# Patient Record
Sex: Female | Born: 1975 | Race: White | Hispanic: No | Marital: Married | Smoking: Former smoker
Health system: Southern US, Community
[De-identification: ages and names within clinical notes are randomized; demographics above are authoritative.]

## PROBLEM LIST (undated history)

## (undated) DIAGNOSIS — K9041 Non-celiac gluten sensitivity: Secondary | ICD-10-CM

## (undated) DIAGNOSIS — N920 Excessive and frequent menstruation with regular cycle: Secondary | ICD-10-CM

## (undated) DIAGNOSIS — N939 Abnormal uterine and vaginal bleeding, unspecified: Secondary | ICD-10-CM

## (undated) DIAGNOSIS — Z973 Presence of spectacles and contact lenses: Secondary | ICD-10-CM

## (undated) DIAGNOSIS — K644 Residual hemorrhoidal skin tags: Secondary | ICD-10-CM

## (undated) DIAGNOSIS — F909 Attention-deficit hyperactivity disorder, unspecified type: Secondary | ICD-10-CM

## (undated) DIAGNOSIS — K219 Gastro-esophageal reflux disease without esophagitis: Secondary | ICD-10-CM

## (undated) DIAGNOSIS — N3281 Overactive bladder: Secondary | ICD-10-CM

## (undated) DIAGNOSIS — F419 Anxiety disorder, unspecified: Secondary | ICD-10-CM

## (undated) DIAGNOSIS — K621 Rectal polyp: Secondary | ICD-10-CM

## (undated) HISTORY — DX: Overactive bladder: N32.81

## (undated) HISTORY — DX: Anxiety disorder, unspecified: F41.9

## (undated) HISTORY — DX: Non-celiac gluten sensitivity: K90.41

## (undated) HISTORY — DX: Gastro-esophageal reflux disease without esophagitis: K21.9

---

## 2006-12-20 ENCOUNTER — Ambulatory Visit: Payer: Self-pay | Admitting: Internal Medicine

## 2006-12-20 DIAGNOSIS — Z9189 Other specified personal risk factors, not elsewhere classified: Secondary | ICD-10-CM | POA: Insufficient documentation

## 2008-01-08 ENCOUNTER — Ambulatory Visit: Payer: Self-pay | Admitting: Family Medicine

## 2008-01-08 DIAGNOSIS — K900 Celiac disease: Secondary | ICD-10-CM | POA: Insufficient documentation

## 2008-01-12 LAB — CONVERTED CEMR LAB
Albumin: 4.2 g/dL (ref 3.5–5.2)
Alkaline Phosphatase: 37 units/L — ABNORMAL LOW (ref 39–117)
BUN: 10 mg/dL (ref 6–23)
Basophils Absolute: 0.1 10*3/uL (ref 0.0–0.1)
Eosinophils Absolute: 0.4 10*3/uL (ref 0.0–0.7)
Eosinophils Relative: 5.8 % — ABNORMAL HIGH (ref 0.0–5.0)
GFR calc Af Amer: 184 mL/min
GFR calc non Af Amer: 152 mL/min
HCT: 38.8 % (ref 36.0–46.0)
MCHC: 34.7 g/dL (ref 30.0–36.0)
MCV: 86.5 fL (ref 78.0–100.0)
Monocytes Absolute: 0.5 10*3/uL (ref 0.1–1.0)
Platelets: 242 10*3/uL (ref 150–400)
Potassium: 4.1 meq/L (ref 3.5–5.1)
RDW: 12.4 % (ref 11.5–14.6)
Sodium: 138 meq/L (ref 135–145)
Total Bilirubin: 0.6 mg/dL (ref 0.3–1.2)

## 2008-07-19 ENCOUNTER — Ambulatory Visit: Payer: Self-pay | Admitting: Family Medicine

## 2008-07-19 DIAGNOSIS — G4700 Insomnia, unspecified: Secondary | ICD-10-CM | POA: Insufficient documentation

## 2008-11-03 ENCOUNTER — Ambulatory Visit: Payer: Self-pay | Admitting: Family Medicine

## 2010-08-03 ENCOUNTER — Encounter: Payer: Self-pay | Admitting: Family Medicine

## 2010-08-04 ENCOUNTER — Encounter: Payer: Self-pay | Admitting: Family Medicine

## 2010-08-04 ENCOUNTER — Ambulatory Visit (INDEPENDENT_AMBULATORY_CARE_PROVIDER_SITE_OTHER): Payer: BC Managed Care – PPO | Admitting: Family Medicine

## 2010-08-04 VITALS — BP 112/78 | HR 76 | Temp 98.9°F | Wt 118.0 lb

## 2010-08-04 DIAGNOSIS — J029 Acute pharyngitis, unspecified: Secondary | ICD-10-CM | POA: Insufficient documentation

## 2010-08-04 MED ORDER — LIDOCAINE VISCOUS 2 % MT SOLN: 5.0000 mL | OROMUCOSAL | Status: DC | PRN

## 2010-08-04 NOTE — Progress Notes (Signed)
duration of symptoms: Several days.  Voice change noted. Rhinorrhea: no congestion:no ear pain:no sore throat:yes cough:minimal myalgias:yes Fevers: no No sick contacts.  No help with dayquil/nyquil.    ROS: See HPI.  Otherwise negative.    Meds, vitals, and allergies reviewed.   GEN: nad, alert and oriented HEENT: mucous membranes moist, TM w/o erythema, nasal epithelium not injected, OP with cobblestoning but no exudates NECK: supple w/o LA CV: rrr. PULM: ctab, no inc wob EXT: no edema  RST neg.

## 2010-08-04 NOTE — Patient Instructions (Addendum)
Drink plenty of fluids, take tylenol or ibuprofen as needed, and gargle with warm salt water (or use the lidocaine) for your throat.  This should gradually improve.  Take care.  Let us know if you have other concerns.

## 2010-08-04 NOTE — Assessment & Plan Note (Signed)
Likely viral, supportive tx and fu prn.  D/w pt.

## 2013-07-24 ENCOUNTER — Ambulatory Visit (INDEPENDENT_AMBULATORY_CARE_PROVIDER_SITE_OTHER): Payer: BC Managed Care – PPO | Admitting: Family Medicine

## 2013-07-24 ENCOUNTER — Encounter: Payer: Self-pay | Admitting: Family Medicine

## 2013-07-24 VITALS — BP 102/70 | HR 84 | Temp 98.0°F | Ht 61.0 in | Wt 107.8 lb

## 2013-07-24 DIAGNOSIS — Z635 Disruption of family by separation and divorce: Secondary | ICD-10-CM | POA: Insufficient documentation

## 2013-07-24 DIAGNOSIS — Z13220 Encounter for screening for lipoid disorders: Secondary | ICD-10-CM

## 2013-07-24 DIAGNOSIS — Z131 Encounter for screening for diabetes mellitus: Secondary | ICD-10-CM

## 2013-07-24 DIAGNOSIS — Z0000 Encounter for general adult medical examination without abnormal findings: Secondary | ICD-10-CM

## 2013-07-24 DIAGNOSIS — Z23 Encounter for immunization: Secondary | ICD-10-CM

## 2013-07-24 LAB — LIPID PANEL
CHOL/HDL RATIO: 2
Cholesterol: 159 mg/dL (ref 0–200)
HDL: 83.8 mg/dL (ref >39.00)
LDL Cholesterol: 61 mg/dL (ref 0–99)
Triglycerides: 70 mg/dL (ref 0.0–149.0)
VLDL: 14 mg/dL (ref 0.0–40.0)

## 2013-07-24 LAB — GLUCOSE, RANDOM: Glucose, Bld: 69 mg/dL — ABNORMAL LOW (ref 70–99)

## 2013-07-24 NOTE — Progress Notes (Signed)
Pre visit review using our clinic review tool, if applicable. No additional management support is needed unless otherwise documented below in the visit note.  CPE- See plan.  Routine anticipatory guidance given to patient.  See health maintenance. Pap done ~1.5years ago per gyn. DXA/mammogram not due Colon cancer screening not due.  Lipids and sugar pending. tdap today.  Living will encouraged.  Mother designated if patient were incapacitated.    Separated from husband.  Soon to move out. At home with husband and 2 daughters now.  Safe at home.  No SI/HI.  Dec in focus, more tearful.  occ appetite and sleep changes.  Worried about the situation, caring for her kids,etc.  Interested in counseling. Prev on zoloft w/o much improvement, that was not recent. D/w pt about counseling and/or meds.   PMH and SH reviewed  Meds, vitals, and allergies reviewed.   ROS: See HPI.  Otherwise negative.    GEN: nad, alert and oriented, tearful but regains composure.  Speech fluent and judgement intact HEENT: mucous membranes moist NECK: supple w/o LA CV: rrr. PULM: ctab, no inc wob ABD: soft, +bs EXT: no edema SKIN: no acute rash

## 2013-07-24 NOTE — Patient Instructions (Signed)
Go see Shirlee Limerick on the way out about getting set up for counseling. Go to the lab on the way out.  We'll contact you with your lab report. Take care.  Glad to see you.

## 2013-07-24 NOTE — Assessment & Plan Note (Signed)
Separated from husband.  Soon to move out. At home with husband and 2 daughters now.  Safe at home.  No SI/HI.  Dec in focus, more tearful.  occ appetite and sleep changes.  Worried about the situation, caring for her kids,etc.  Interested in counseling. Prev on zoloft w/o much improvement, that was not recent. D/w pt about counseling and/or meds. Refer for counseling in the meantime.  Hold off on meds for now.  She contracts for safety.

## 2013-07-24 NOTE — Assessment & Plan Note (Signed)
Routine anticipatory guidance given to patient. See health maintenance.  Pap done ~1.5years ago per gyn.  DXA/mammogram not due  Colon cancer screening not due.  Lipids and sugar pending.  tdap today.  Living will encouraged. Mother designated if patient were incapacitated.

## 2013-08-20 ENCOUNTER — Ambulatory Visit (INDEPENDENT_AMBULATORY_CARE_PROVIDER_SITE_OTHER): Payer: BC Managed Care – PPO | Admitting: Psychology

## 2013-08-20 DIAGNOSIS — F4323 Adjustment disorder with mixed anxiety and depressed mood: Secondary | ICD-10-CM

## 2013-09-09 ENCOUNTER — Ambulatory Visit (INDEPENDENT_AMBULATORY_CARE_PROVIDER_SITE_OTHER): Payer: BC Managed Care – PPO | Admitting: Psychology

## 2013-09-09 DIAGNOSIS — F4323 Adjustment disorder with mixed anxiety and depressed mood: Secondary | ICD-10-CM

## 2013-09-23 ENCOUNTER — Ambulatory Visit (INDEPENDENT_AMBULATORY_CARE_PROVIDER_SITE_OTHER): Payer: BC Managed Care – PPO | Admitting: Psychology

## 2013-09-23 DIAGNOSIS — F4323 Adjustment disorder with mixed anxiety and depressed mood: Secondary | ICD-10-CM

## 2013-10-07 ENCOUNTER — Ambulatory Visit (INDEPENDENT_AMBULATORY_CARE_PROVIDER_SITE_OTHER): Payer: BC Managed Care – PPO | Admitting: Psychology

## 2013-10-07 DIAGNOSIS — F4323 Adjustment disorder with mixed anxiety and depressed mood: Secondary | ICD-10-CM

## 2013-10-28 ENCOUNTER — Ambulatory Visit: Payer: BC Managed Care – PPO | Admitting: Psychology

## 2013-10-28 ENCOUNTER — Telehealth: Payer: Self-pay

## 2013-10-28 ENCOUNTER — Ambulatory Visit (INDEPENDENT_AMBULATORY_CARE_PROVIDER_SITE_OTHER): Payer: BC Managed Care – PPO | Admitting: Psychology

## 2013-10-28 DIAGNOSIS — F331 Major depressive disorder, recurrent, moderate: Secondary | ICD-10-CM

## 2013-10-28 NOTE — Telephone Encounter (Signed)
I need all the records first.  She needs formal testing done.   We can try to go from there.

## 2013-10-28 NOTE — Telephone Encounter (Signed)
Pt has been seeing [facility] and saw Dr Reggy Eye today; pt cannot afford to continue to pay high amts out of pocket to see these behavioral doctors and not see any results. Pt is having problems at work focusing. Dr Reggy Eye did not do ADD testing today. A friend gave pt Adderall XR 30 mg last week and pt took 1/2 tab q 3rd day and felt better. Pt is not sure if she is experiencing ADD or depression or both. Pt wants to know if Dr Para March could prescribe something since pt cannot afford to continue present course of see behavioral doctors.Please advise.

## 2013-10-28 NOTE — Telephone Encounter (Signed)
Pt advised as instructed and pt voiced understanding and will get testing and then contact Dr Lianne Bushy office for appt.

## 2013-11-18 ENCOUNTER — Ambulatory Visit (INDEPENDENT_AMBULATORY_CARE_PROVIDER_SITE_OTHER): Payer: BC Managed Care – PPO | Admitting: Psychology

## 2013-11-18 DIAGNOSIS — F988 Other specified behavioral and emotional disorders with onset usually occurring in childhood and adolescence: Secondary | ICD-10-CM

## 2013-11-18 DIAGNOSIS — F330 Major depressive disorder, recurrent, mild: Secondary | ICD-10-CM

## 2013-11-18 DIAGNOSIS — F411 Generalized anxiety disorder: Secondary | ICD-10-CM

## 2013-11-23 ENCOUNTER — Telehealth: Payer: Self-pay | Admitting: *Deleted

## 2013-11-23 NOTE — Telephone Encounter (Signed)
Patient notified as noted on hard copy. Appointment scheduled with Dr. Para March 11/25/13.

## 2013-11-23 NOTE — Telephone Encounter (Signed)
I made a note on the hard copy.  Please call pt about it. Thanks.

## 2013-11-23 NOTE — Telephone Encounter (Signed)
Pt was calling to see if you have received the results of her ADD/ADHD testing from Dr Rosemarie Beath yet? She says they were supposed to be sent here by last Fri. She also wanted to speak with you re: some anxiety issues she's been having.

## 2013-11-25 ENCOUNTER — Encounter: Payer: Self-pay | Admitting: Family Medicine

## 2013-11-25 ENCOUNTER — Ambulatory Visit (INDEPENDENT_AMBULATORY_CARE_PROVIDER_SITE_OTHER): Payer: BC Managed Care – PPO | Admitting: Family Medicine

## 2013-11-25 VITALS — BP 110/74 | HR 104 | Temp 98.6°F | Wt 106.5 lb

## 2013-11-25 DIAGNOSIS — F418 Other specified anxiety disorders: Secondary | ICD-10-CM

## 2013-11-25 DIAGNOSIS — F341 Dysthymic disorder: Secondary | ICD-10-CM

## 2013-11-25 MED ORDER — BUPROPION HCL ER (SR) 100 MG PO TB12: 100.0000 mg | ORAL_TABLET | Freq: Two times a day (BID) | ORAL | Status: DC

## 2013-11-25 NOTE — Patient Instructions (Signed)
Start with 1 wellbutrin a day for about 3-4 days, then increase to twice a day if needed.  Keep taking benadryl at night and update me next week (or sooner if needed) via mychart or a phone call.   Glad to see you.  Take care.

## 2013-11-25 NOTE — Progress Notes (Signed)
Pre visit review using our clinic review tool, if applicable. No additional management support is needed unless otherwise documented below in the visit note.  Psychology notes reviewed with patient.  She has sx suggestive of ADD, but likely exacerbated/due to underlying depression and anxiety.  Out of her home now, living on her own with her kids.  Separated, safe at home. No SI/HI.  Tearful, worried, fatigued, sleep disrupted. Appetite disrupted.  Trouble concentration.  Anxious.  Asking about options.  Discussed.    Meds, vitals, and allergies reviewed.   ROS: See HPI.  Otherwise, noncontributory.  GEN: nad, alert and oriented, tearful, regains composure.  HEENT: mucous membranes moist NECK: supple w/o LA CV: rrr.  no murmur Speech wnl.  Judgement intact

## 2013-11-26 DIAGNOSIS — F418 Other specified anxiety disorders: Secondary | ICD-10-CM | POA: Insufficient documentation

## 2013-11-26 NOTE — Assessment & Plan Note (Signed)
With sig upheaval at home.  D/w pt.  Would start wellbutrin.  It may help with depression/anxiety and concentration.  SSRI years ago didn't help much.  She agrees.  Routine cautions given, no h/o SZ d/o.  She'll update me in a few days.  Okay for outpatient f/u.

## 2013-12-02 ENCOUNTER — Encounter: Payer: Self-pay | Admitting: Family Medicine

## 2013-12-16 ENCOUNTER — Other Ambulatory Visit: Payer: Self-pay | Admitting: Family Medicine

## 2013-12-16 MED ORDER — BUPROPION HCL ER (SR) 100 MG PO TB12: ORAL_TABLET | ORAL | Status: DC

## 2013-12-22 ENCOUNTER — Encounter: Payer: Self-pay | Admitting: Family Medicine

## 2013-12-23 ENCOUNTER — Encounter: Payer: Self-pay | Admitting: Family Medicine

## 2013-12-23 DIAGNOSIS — R41840 Attention and concentration deficit: Secondary | ICD-10-CM | POA: Insufficient documentation

## 2013-12-29 ENCOUNTER — Telehealth: Payer: Self-pay | Admitting: Family Medicine

## 2013-12-29 MED ORDER — ESCITALOPRAM OXALATE 10 MG PO TABS: ORAL_TABLET | ORAL | Status: DC

## 2013-12-29 NOTE — Telephone Encounter (Signed)
Called pt.  See mychart message.  Would be reasonable to try lexapro.  She'll take 10mg  a day for 1 week, then increase to 20mg  a day if needed at that point. She'll update me in a few days.  She agrees with the plan.  Would continue wellbutrin for now.  rx sent.

## 2014-01-11 ENCOUNTER — Encounter: Payer: Self-pay | Admitting: Family Medicine

## 2014-01-12 ENCOUNTER — Other Ambulatory Visit: Payer: Self-pay | Admitting: Family Medicine

## 2014-01-13 MED ORDER — BUPROPION HCL ER (SR) 100 MG PO TB12: ORAL_TABLET | ORAL | Status: DC

## 2014-01-13 NOTE — Telephone Encounter (Signed)
Sent. Thanks.   

## 2014-02-07 ENCOUNTER — Other Ambulatory Visit: Payer: Self-pay | Admitting: Family Medicine

## 2014-02-07 DIAGNOSIS — F418 Other specified anxiety disorders: Secondary | ICD-10-CM

## 2014-05-19 ENCOUNTER — Other Ambulatory Visit: Payer: Self-pay

## 2014-05-19 ENCOUNTER — Encounter: Payer: Self-pay | Admitting: Family Medicine

## 2014-05-19 DIAGNOSIS — Z1231 Encounter for screening mammogram for malignant neoplasm of breast: Secondary | ICD-10-CM

## 2014-06-02 ENCOUNTER — Ambulatory Visit
Admission: RE | Admit: 2014-06-02 | Discharge: 2014-06-02 | Disposition: A | Discharge status: Home / Self Care | Payer: BLUE CROSS/BLUE SHIELD | Source: Ambulatory Visit

## 2014-06-02 DIAGNOSIS — Z1231 Encounter for screening mammogram for malignant neoplasm of breast: Secondary | ICD-10-CM

## 2014-06-03 ENCOUNTER — Other Ambulatory Visit: Payer: Self-pay | Admitting: Family Medicine

## 2014-06-03 DIAGNOSIS — R928 Other abnormal and inconclusive findings on diagnostic imaging of breast: Secondary | ICD-10-CM

## 2014-06-04 ENCOUNTER — Ambulatory Visit
Admission: RE | Admit: 2014-06-04 | Discharge: 2014-06-04 | Disposition: A | Discharge status: Home / Self Care | Payer: BLUE CROSS/BLUE SHIELD | Source: Ambulatory Visit | Attending: Family Medicine | Admitting: Family Medicine

## 2014-06-04 ENCOUNTER — Encounter: Payer: Self-pay | Admitting: *Deleted

## 2014-06-04 DIAGNOSIS — R928 Other abnormal and inconclusive findings on diagnostic imaging of breast: Secondary | ICD-10-CM

## 2014-06-04 HISTORY — PX: BREAST ENHANCEMENT SURGERY: SHX7

## 2014-10-29 ENCOUNTER — Ambulatory Visit (INDEPENDENT_AMBULATORY_CARE_PROVIDER_SITE_OTHER): Payer: BLUE CROSS/BLUE SHIELD | Admitting: Family Medicine

## 2014-10-29 ENCOUNTER — Encounter: Payer: Self-pay | Admitting: Family Medicine

## 2014-10-29 VITALS — BP 116/62 | HR 102 | Temp 98.3°F | Wt 116.0 lb

## 2014-10-29 DIAGNOSIS — F418 Other specified anxiety disorders: Secondary | ICD-10-CM

## 2014-10-29 MED ORDER — TRAZODONE HCL 50 MG PO TABS: 25.0000 mg | ORAL_TABLET | Freq: Every evening | ORAL | Status: DC | PRN

## 2014-10-29 MED ORDER — DIPHENHYDRAMINE HCL 25 MG PO TABS
25.0000 mg | ORAL_TABLET | Freq: Every evening | ORAL | Status: DC | PRN
Start: 2014-10-29 — End: 2015-01-07

## 2014-10-29 MED ORDER — BUPROPION HCL ER (SR) 100 MG PO TB12: 200.0000 mg | ORAL_TABLET | Freq: Every morning | ORAL | Status: DC

## 2014-10-29 NOTE — Progress Notes (Signed)
Pre visit review using our clinic review tool, if applicable. No additional management support is needed unless otherwise documented below in the visit note.  Living with her kids, separated.  She isn't going back in the relationship.  Safe at home.   She has had lifelong sleep troubles, even before the recent upheaval.   She is taking benadryl at night,  qhs.  She was originally taking it prn, sleep has gotten worse in the last year.   In the last week, going to bed at 2AM, then frequent waking.   She has been on wellbutrin, up to .  She cut back to 200, to cut out the PM dose to see if that would help with sleeping.  She thinks wellbutrin helped with anxiety (  same as ) but not as much with depression.   Lexapro didn't help much with depression. No SI/HI.   Minimal etoh, no illicits.  Minimal caffeine.   She had seen Dr. Tomasa Rand at [place], with little help.  She doesn't have a hx of manic episodes.   She doesn't have dx BAD.     Meds, vitals, and allergies reviewed.   ROS: See HPI.  Otherwise, noncontributory.  nad Tearful but regains composure Speech and judgement wnl rrr ctab abd soft, not ttp Ext w/o edema

## 2014-10-29 NOTE — Assessment & Plan Note (Signed)
Not improved, sleep is worse.  D/w pt about options.  Continue wellbutrin  a day.  Continue prn benadryl Add on trazodone.   She'll update me in about 1 week.  Okay for outpatient f/u.  Stay off lexapro.   We can address remaining mood issues after her sleep situation improves.  We both think her mood may improve with better sleep/rest.  >25 minutes spent in face to face time with patient, >50% spent in counselling or coordination of care.

## 2014-10-29 NOTE — Patient Instructions (Signed)
Continue wellbutrin  a day.  Continue as needed benadryl Add on trazodone, 25-50mg  at night.  Start with .   Update me in about 1 week.  Take care.  Glad to see you.

## 2014-10-29 NOTE — Addendum Note (Signed)
Addended by: Joaquim Nam on: 10/29/2014 08:52 AM   Modules accepted: Orders

## 2014-11-22 ENCOUNTER — Other Ambulatory Visit: Payer: Self-pay | Admitting: Surgery

## 2014-11-22 DIAGNOSIS — R1011 Right upper quadrant pain: Secondary | ICD-10-CM

## 2014-11-30 ENCOUNTER — Ambulatory Visit
Admission: RE | Admit: 2014-11-30 | Discharge: 2014-11-30 | Disposition: A | Discharge status: Home / Self Care | Payer: BLUE CROSS/BLUE SHIELD | Source: Ambulatory Visit | Attending: Surgery | Admitting: Surgery

## 2014-11-30 DIAGNOSIS — R1011 Right upper quadrant pain: Secondary | ICD-10-CM

## 2014-12-08 ENCOUNTER — Other Ambulatory Visit (HOSPITAL_COMMUNITY): Payer: Self-pay | Admitting: Surgery

## 2014-12-08 DIAGNOSIS — R1011 Right upper quadrant pain: Secondary | ICD-10-CM

## 2015-01-07 ENCOUNTER — Ambulatory Visit (INDEPENDENT_AMBULATORY_CARE_PROVIDER_SITE_OTHER): Payer: BLUE CROSS/BLUE SHIELD | Admitting: Family Medicine

## 2015-01-07 ENCOUNTER — Encounter: Payer: Self-pay | Admitting: Family Medicine

## 2015-01-07 VITALS — BP 104/58 | HR 80 | Temp 98.0°F | Ht 61.5 in | Wt 111.0 lb

## 2015-01-07 DIAGNOSIS — F418 Other specified anxiety disorders: Secondary | ICD-10-CM

## 2015-01-07 DIAGNOSIS — Z7189 Other specified counseling: Secondary | ICD-10-CM

## 2015-01-07 DIAGNOSIS — Z0000 Encounter for general adult medical examination without abnormal findings: Secondary | ICD-10-CM

## 2015-01-07 DIAGNOSIS — Z131 Encounter for screening for diabetes mellitus: Secondary | ICD-10-CM

## 2015-01-07 LAB — GLUCOSE, RANDOM: Glucose, Bld: 80 mg/dL (ref 70–99)

## 2015-01-07 MED ORDER — ESCITALOPRAM OXALATE 10 MG PO TABS: ORAL_TABLET | ORAL | Status: DC

## 2015-01-07 NOTE — Progress Notes (Signed)
Pre visit review using our clinic review tool, if applicable. No additional management support is needed unless otherwise documented below in the visit note.  CPE- See plan.  Routine anticipatory guidance given to patient.  See health maintenance. Tetanus 2015 PNA and shingles not due Colon cancer screening not due.  Mammogram 2016 DXA not due.   Pap this year per GYN clinic (4 months ago) Flu shot encouraged.  Declined.   Living will d/w pt.  Would have her mother designated if patient were incapacitated.   Still separated.  Sleeping better on trazodone.  Mood is better overall.  She wanted to try lexapro again.  D/w pt.  Safe at home.  She is still "worried about everything" and wellbutrin wasn't helping a lot with that.  Anxiety is long standing.  wellbutrin helped more with the depression.  No SI/HI.  Not a true allergy to lexapro prev.    PMH and SH reviewed  Meds, vitals, and allergies reviewed.   ROS: See HPI.  Otherwise negative.    GEN: nad, alert and oriented, tearful but regains composure HEENT: mucous membranes moist NECK: supple w/o LA CV: rrr. PULM: ctab, no inc wob ABD: soft, +bs EXT: no edema

## 2015-01-07 NOTE — Patient Instructions (Signed)
Go to the lab on the way out.  We'll contact you with your lab report. Take care.  Glad to see you.  Try adding on the lexapro and update me as needed.

## 2015-01-09 DIAGNOSIS — Z7189 Other specified counseling: Secondary | ICD-10-CM | POA: Insufficient documentation

## 2015-01-09 NOTE — Assessment & Plan Note (Signed)
Depression improved, still with some anxiety. Sleep is better.  Add on lexapro for retrial and she'll update me.  Okay for outpatient f/u.

## 2015-01-09 NOTE — Assessment & Plan Note (Signed)
Tetanus 2015  PNA and shingles not due  Colon cancer screening not due.  Mammogram 2016  DXA not due.  Pap this year per GYN clinic (4 months ago)  Flu shot encouraged. Declined.  Living will d/w pt. Would have her mother designated if patient were incapacitated.  See notes on Dm2 screening with glucose.

## 2015-05-16 ENCOUNTER — Ambulatory Visit (INDEPENDENT_AMBULATORY_CARE_PROVIDER_SITE_OTHER): Payer: 59 | Admitting: Primary Care

## 2015-05-16 ENCOUNTER — Encounter: Payer: Self-pay | Admitting: Primary Care

## 2015-05-16 VITALS — BP 114/64 | HR 80 | Temp 98.3°F | Ht 61.5 in | Wt 121.1 lb

## 2015-05-16 DIAGNOSIS — R202 Paresthesia of skin: Secondary | ICD-10-CM | POA: Diagnosis not present

## 2015-05-16 DIAGNOSIS — R200 Anesthesia of skin: Secondary | ICD-10-CM

## 2015-05-16 NOTE — Progress Notes (Signed)
Subjective:    Patient ID: [name] [name], female    DOB: [birth year], 40 y.o.   MRN: [id number]  HPI  [name] [name] is a 40 year old female who presents today with a chief complaint of numbness/tingling. Her symptoms are located to the right hand to the 3rd and 4th digits. Her symptoms can be felt in her right mid forearm on downward.  Her symptoms have been present for the past 2 weeks. Her symptoms haven't been bothersome until Saturday evening this past weekend as she noticed an increase in numbness.    Denies recent injury or trauma. 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 She's taken ibuprofen without improvement. She's been wearing a brace (designed for carpal tunnel) during the day while typing and intermittently at bedtime for about 1 week and she's noticed no improvement. She's also had a massage without improvement. She endorses a normal TSH from GYN recently, has not had vitamin B12 or folate evaluation. Denies recent injury, shoulder pain, neck pain, back pain.  Review of Systems  Constitutional: Negative for fever.  Musculoskeletal: Positive for myalgias. Negative for arthralgias.  Skin: Negative for color change.  Neurological: Positive for numbness. Negative for weakness.       Past Medical History  Diagnosis Date  . Depression   . Blood in stool   . Gluten intolerance     allergy  . Overactive bladder     that is triggered by stress    Social History   Social History  . Marital Status: Divorced    Spouse Name: N/A  . Number of Children: N/A  . Years of Education: N/A   Occupational History  . Not on file.   Social History Main Topics  . Smoking status: Former Smoker    Quit date: 12/03/2005  . Smokeless tobacco: Not on file     Comment: Quit Oct 2007  . Alcohol Use: 0.0 oz/week    0 Standard drinks or equivalent per week     Comment: occassionally  . Drug Use: No  . Sexual Activity: Not on file   Other Topics Concern    . Not on file   Social History Narrative   Occupation: IT consultantparalegal      G3P2-- 2 daughters      Separated as of 2015      Former Smoker (quit oct 07)    No past surgical history on file.  Family History  Problem Relation Age of Onset  . Arthritis      Grandparents  . Lung cancer      grandparent  . Multiple sclerosis Maternal Aunt   . Cancer Maternal Aunt     ? kind  . Breast cancer Neg Hx   . Colon cancer Neg Hx     No Known Allergies  Current Outpatient Prescriptions on File Prior to Visit  Medication Sig Dispense Refill  . traZODone (DESYREL) 50 MG tablet Take 0.5-1 tablets (25-50 mg total) by mouth at bedtime as needed for sleep. 30 tablet 12   No current facility-administered medications on file prior to visit.    BP 114/64 mmHg  Pulse 80  Temp(Src) 98.3 F (36.8 C) (Oral)  Ht 5' 1.5" (1.562 m)  Wt 121 lb 1.9 oz (54.94 kg)  BMI 22.52 kg/m2  SpO2 99%    Objective:   Physical Exam  Constitutional: She appears well-nourished.  Cardiovascular: Normal rate and regular rhythm.   Pulmonary/Chest: Effort  normal and breath sounds normal.  Musculoskeletal:       Right shoulder: She exhibits normal range of motion, no tenderness and no pain.       Right wrist: She exhibits normal range of motion, no tenderness and no swelling.       Cervical back: She exhibits normal range of motion, no tenderness and no pain.  Neurological:  Negative Phalen's and Tinel's sign.  Skin: Skin is warm and dry.          Assessment & Plan:  Numbness/Tingling:  Present to 3rd and 4th digits of right hand. Also to right mid forearm. Works typing on her computer 4 hours daily. No recent injury. Exam with negative Phalen's and Tinel's sign. No color changes. Do not suspect this is originating from neck or shoulder. No not suspect raynauds at this point. Suspect more carpal tunnel at this point. Will have her try naproxen, continue wrist splint, rest. Offered lab work to rule out  possible metabolic causes, she declines.  If no improvement in 2 weeks, will consider B12, folate, EMG testing.

## 2015-05-16 NOTE — Progress Notes (Signed)
Pre visit review using our clinic review tool, if applicable. No additional management support is needed unless otherwise documented below in the visit note. 

## 2015-05-16 NOTE — Patient Instructions (Signed)
Continue to wear your splint while typing at work. Also wear this at bedtime.  You may try taking Naproxen twice daily as needed.  Please notify me if no improvement in symptoms in 2 weeks. We will consider sending you to neurology for further evaluation and nerve conduction studies.  It was a pleasure meeting you!

## 2015-05-19 ENCOUNTER — Encounter: Payer: Self-pay | Admitting: Family Medicine

## 2015-05-24 ENCOUNTER — Encounter: Payer: Self-pay | Admitting: Family Medicine

## 2015-05-24 ENCOUNTER — Ambulatory Visit (INDEPENDENT_AMBULATORY_CARE_PROVIDER_SITE_OTHER): Payer: 59 | Admitting: Family Medicine

## 2015-05-24 VITALS — BP 98/64 | HR 95 | Temp 99.0°F | Wt 118.2 lb

## 2015-05-24 DIAGNOSIS — F909 Attention-deficit hyperactivity disorder, unspecified type: Secondary | ICD-10-CM

## 2015-05-24 DIAGNOSIS — R202 Paresthesia of skin: Secondary | ICD-10-CM | POA: Diagnosis not present

## 2015-05-24 DIAGNOSIS — F988 Other specified behavioral and emotional disorders with onset usually occurring in childhood and adolescence: Secondary | ICD-10-CM

## 2015-05-24 MED ORDER — METHYLPHENIDATE HCL 5 MG PO TABS: 5.0000 mg | ORAL_TABLET | Freq: Two times a day (BID) | ORAL | Status: DC

## 2015-05-24 NOTE — Progress Notes (Signed)
Pre visit review using our clinic review tool, if applicable. No additional management support is needed unless otherwise documented below in the visit note.  R 3rd and 4th finger numbness, currently splinted w/o much relief yet, after recent OV.  R handed.  Worse writing.  A lot of time typing.  No weakness in the hands.    Prev ADD testing done, positive.  She was prev on wellbutrin and SSRI.  lexapro didn't help much.  She had gotten off both meds, with night sweats initially- resolved now.  D/w pt about options.   Wellbutrin helped with depression more than anxiety.   She was asking about tx for concentration.   She feels overwhelmed and that is contributing to anxiety.  "I can't get anything done. I can't stay focused."   No SI/HI. No illicits.  Non smoker.    Prev psych testing report reviewed, with ADD sx likely.   PMH and SH reviewed  ROS: See HPI, otherwise noncontributory.  Meds, vitals, and allergies reviewed.   GEN: nad, alert and oriented HEENT: mucous membranes moist NECK: supple w/o LA CV: rrr. PULM: ctab, no inc wob ABD: soft, +bs EXT: no edema SKIN: no acute rash Tinel pos R wrist but strength wnl R RUE from shoulder down through the fingers.

## 2015-05-24 NOTE — Patient Instructions (Addendum)
If your hand isn't getting better next week, then let me know.   Start with 5mg  ritalin twice a day.  Gradually work up to 10mg  twice a day as needed.  Update me as needed.  Take care.  Glad to see you.

## 2015-05-25 ENCOUNTER — Encounter: Payer: Self-pay | Admitting: Family Medicine

## 2015-05-25 DIAGNOSIS — R202 Paresthesia of skin: Secondary | ICD-10-CM | POA: Insufficient documentation

## 2015-05-25 DIAGNOSIS — F988 Other specified behavioral and emotional disorders with onset usually occurring in childhood and adolescence: Secondary | ICD-10-CM | POA: Insufficient documentation

## 2015-05-25 NOTE — Assessment & Plan Note (Signed)
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  No weakness.

## 2015-05-25 NOTE — Assessment & Plan Note (Signed)
Routine cautions re: ritalin start, with low dose initially, gradually working up to 10mg  bid . She'll update me.  Routine ADE possibilities d/w pt, ie tremor, appetite changes, insomnia.  We may transition to long acting agent if responsive to med.  Stop med and notify me if ADE.  She agrees.  Okay for outpatient f/u.  >25 minutes spent in face to face time with patient, >50% spent in counselling or coordination of care

## 2015-05-25 NOTE — Telephone Encounter (Signed)
Pt left v/m requesting to be emailed back when Dr Para [facility]can [facility]

## 2015-05-26 ENCOUNTER — Telehealth: Payer: Self-pay | Admitting: *Deleted

## 2015-05-26 NOTE — Telephone Encounter (Signed)
Patient email stated that [facility] would be contacting us for a PA on Methylpenidate 5 mg tablet.  [facility] did not contact me but I [facility] in the PA [facility].  Approval was given instantly and faxed approval letter followed stating coverage was good from 05/26/15 to 05/25/16.  [facility] and [facility].

## 2015-05-31 ENCOUNTER — Encounter: Payer: Self-pay | Admitting: Family Medicine

## 2015-06-01 ENCOUNTER — Encounter: Payer: Self-pay | Admitting: Family Medicine

## 2015-06-08 ENCOUNTER — Encounter: Payer: Self-pay | Admitting: Family Medicine

## 2015-06-08 ENCOUNTER — Telehealth: Payer: Self-pay | Admitting: Family Medicine

## 2015-06-08 ENCOUNTER — Other Ambulatory Visit: Payer: Self-pay | Admitting: Family Medicine

## 2015-06-08 MED ORDER — AMPHETAMINE-DEXTROAMPHETAMINE 5 MG PO TABS: 5.0000 mg | ORAL_TABLET | Freq: Two times a day (BID) | ORAL | Status: DC

## 2015-06-08 NOTE — Progress Notes (Signed)
rx printed for pickup.  Thanks.

## 2015-06-08 NOTE — Progress Notes (Signed)
Written prescription given to patient per Dr. Para Marchuncan.

## 2015-06-08 NOTE — Telephone Encounter (Signed)
Prescription given to patient per Dr. Para Marchuncan.

## 2015-06-08 NOTE — Telephone Encounter (Signed)
Pt came by to pick up rx, and i could not locate it. Pt is waiting in the lobby for rx

## 2015-06-09 ENCOUNTER — Telehealth: Payer: Self-pay | Admitting: *Deleted

## 2015-06-09 NOTE — Telephone Encounter (Signed)
[facility]  [facility] and [facility].  [facility]. Sent for scanning.

## 2015-06-21 ENCOUNTER — Encounter: Payer: Self-pay | Admitting: Family Medicine

## 2015-06-23 ENCOUNTER — Other Ambulatory Visit: Payer: Self-pay | Admitting: Family Medicine

## 2015-06-23 MED ORDER — AMPHETAMINE-DEXTROAMPHETAMINE 5 MG PO TABS: ORAL_TABLET | ORAL | Status: DC

## 2015-06-23 MED ORDER — AMPHETAMINE-DEXTROAMPHETAMINE 15 MG PO TABS: 15.0000 mg | ORAL_TABLET | Freq: Two times a day (BID) | ORAL | Status: DC

## 2015-07-21 ENCOUNTER — Telehealth: Payer: Self-pay | Admitting: Family Medicine

## 2015-07-21 ENCOUNTER — Encounter: Payer: Self-pay | Admitting: Family Medicine

## 2015-07-21 MED ORDER — AMPHETAMINE-DEXTROAMPHETAMINE 15 MG PO TABS: 15.0000 mg | ORAL_TABLET | Freq: Two times a day (BID) | ORAL | Status: DC

## 2015-07-21 NOTE — Telephone Encounter (Signed)
rx printed.  Please start PA for this dose.  Thanks.  See mychart message.

## 2015-07-22 NOTE — Telephone Encounter (Signed)
Sent patient a MyChart message that the Rx is ready for pickup and that the PA has been started.

## 2015-08-17 ENCOUNTER — Other Ambulatory Visit: Payer: Self-pay | Admitting: Family Medicine

## 2015-08-17 MED ORDER — AMPHETAMINE-DEXTROAMPHETAMINE 15 MG PO TABS: 15.0000 mg | ORAL_TABLET | Freq: Two times a day (BID) | ORAL | Status: DC

## 2015-08-17 NOTE — Telephone Encounter (Signed)
Last refill 07/21/15 #60 Last office visit 05/24/15

## 2015-08-17 NOTE — Telephone Encounter (Signed)
Patient notified by telephone that script is up front ready for pickup. 

## 2015-08-17 NOTE — Telephone Encounter (Signed)
Printed.  Thanks.  

## 2015-09-15 ENCOUNTER — Other Ambulatory Visit: Payer: Self-pay | Admitting: Family Medicine

## 2015-09-15 MED ORDER — AMPHETAMINE-DEXTROAMPHETAMINE 15 MG PO TABS: 15.0000 mg | ORAL_TABLET | Freq: Two times a day (BID) | ORAL | Status: DC

## 2015-09-15 NOTE — Telephone Encounter (Signed)
Tried to phone the patient, no answer and mailbox was full.  Rx left at front desk for pick up.

## 2015-09-15 NOTE — Telephone Encounter (Signed)
Printed.  Thanks.  

## 2015-09-15 NOTE — Telephone Encounter (Signed)
MyChart refill request.  Last Filled:    60 tablet 0 08/17/2015  CPE:  01/07/15   Please advise.

## 2015-09-16 NOTE — Telephone Encounter (Signed)
Patient notified as instructed by telephone and was advised that she just picked the script up.

## 2015-12-14 ENCOUNTER — Other Ambulatory Visit: Payer: Self-pay | Admitting: Family Medicine

## 2015-12-14 MED ORDER — AMPHETAMINE-DEXTROAMPHETAMINE 15 MG PO TABS: 15.0000 mg | ORAL_TABLET | Freq: Two times a day (BID) | ORAL | 0 refills | Status: DC

## 2015-12-14 NOTE — Telephone Encounter (Signed)
Printed x3.  Thanks.  

## 2015-12-14 NOTE — Telephone Encounter (Signed)
Last refill was on 09/15/15, last OV 05/24/15. Ok to refill?

## 2015-12-14 NOTE — Telephone Encounter (Signed)
Pt was notified rx is ready for pick up.  

## 2015-12-31 IMAGING — MG MM DIAG BREAST TOMO UNI LEFT
4 series · 4 of 12 positions shown · non-contrast
Comparison: Baseline screening mammogram dated 06/02/2014.

CLINICAL DATA: Possible asymmetry in the upper outer left breast on
a recent baseline screening mammogram.

EXAM:
DIGITAL DIAGNOSTIC LEFT MAMMOGRAM WITH 3D TOMOSYNTHESIS AND CAD

[L MLO]
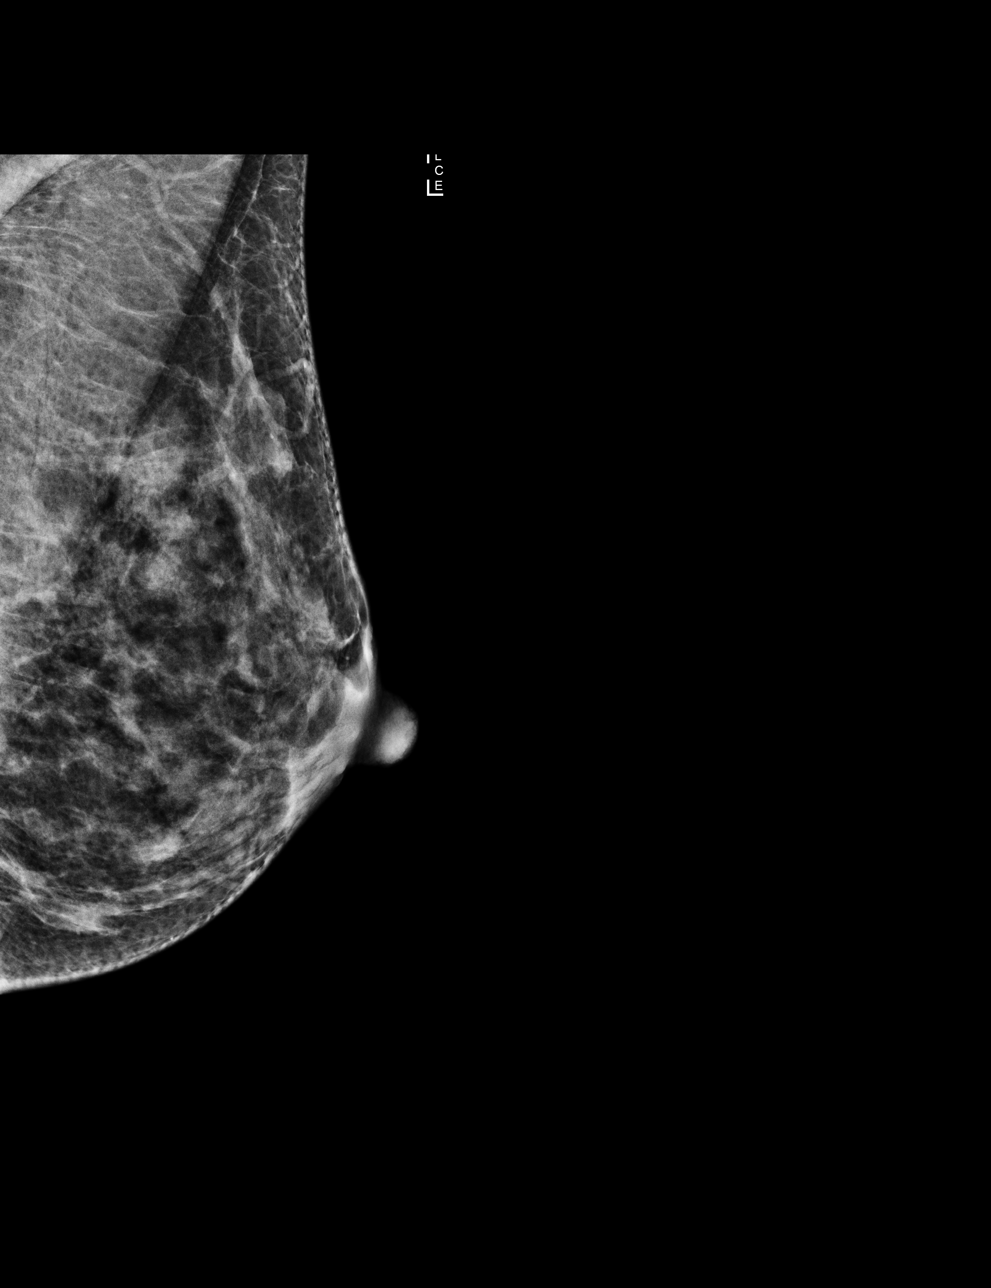

[L CC]
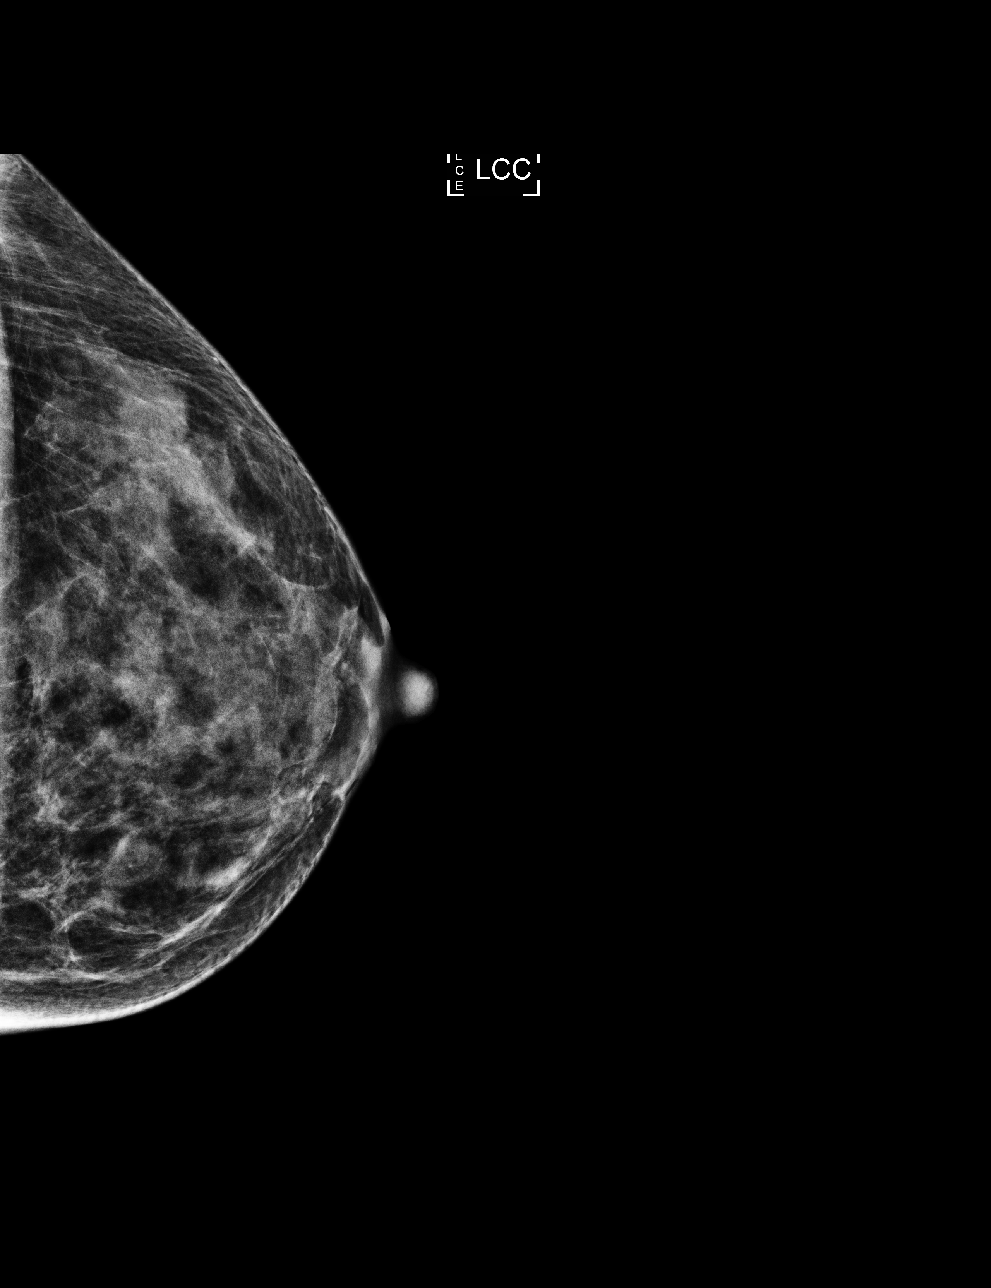

[L MLO tomo · tomo slice 21/41.0]
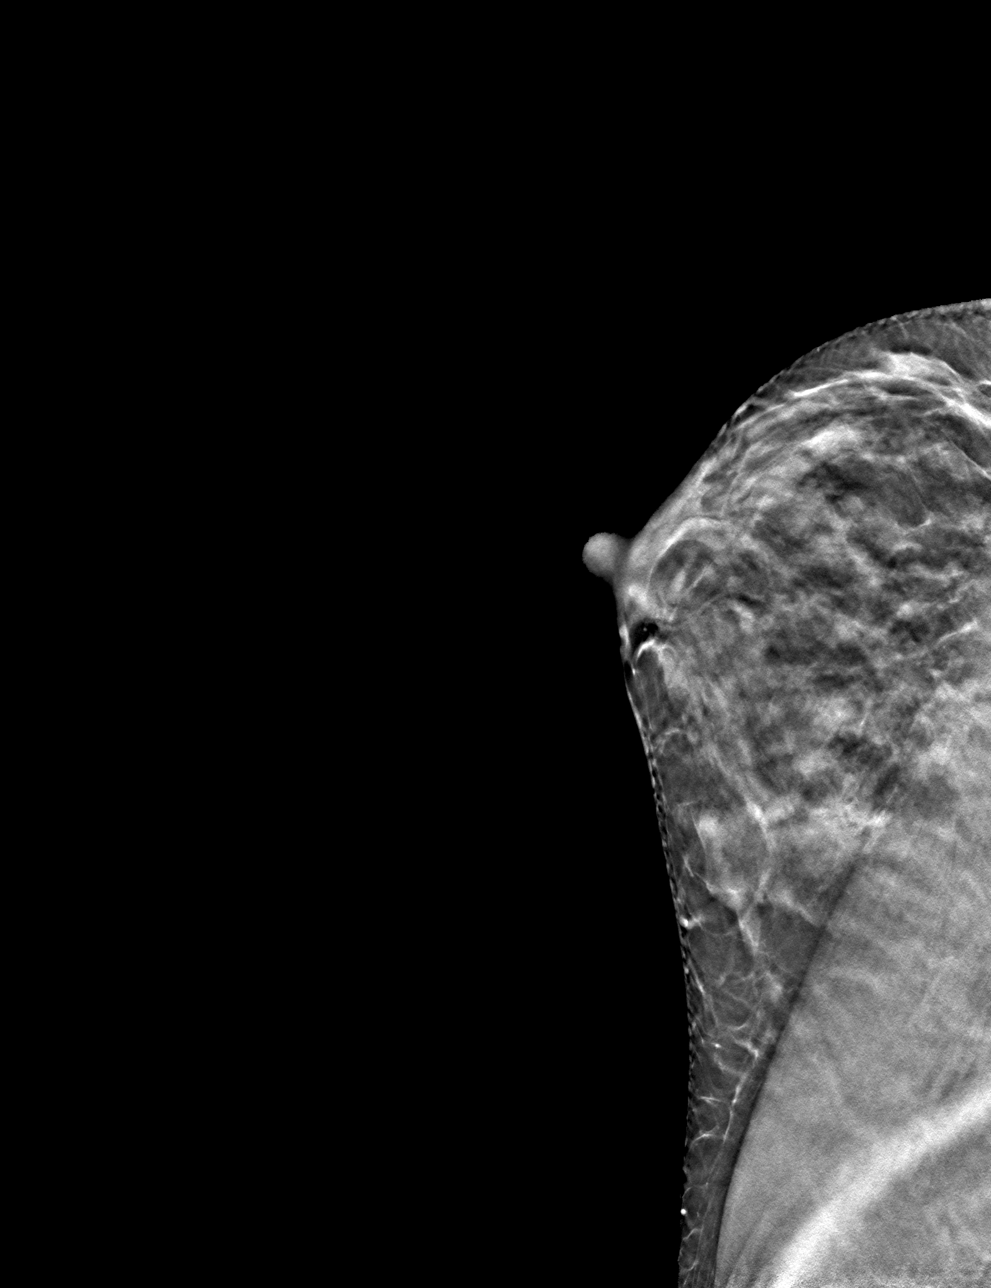

[L CC tomo · tomo slice 21/42.0]
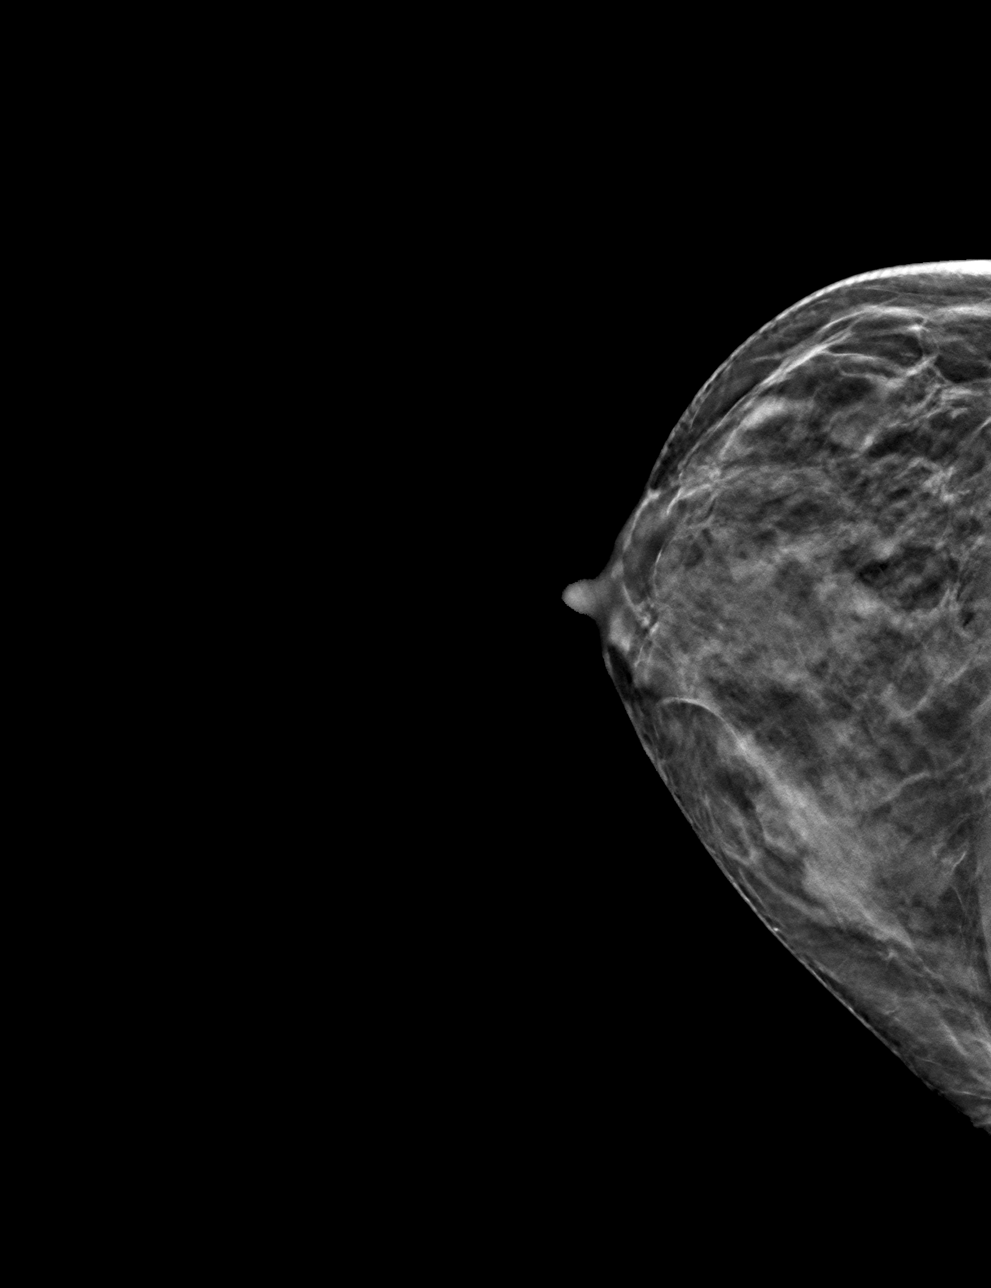

[4 of 12 positions shown; findings below may reference images not displayed]

ACR Breast Density Category c: The breast tissue is heterogeneously
dense, which may obscure small masses.
FINDINGS: 3D tomographic images of the left breast demonstrate normal
appearing fibroglandular tissue at the location of the recently
suspected asymmetry in the posterior aspect of the upper-outer left
breast.

Mammographic images were processed with CAD.
IMPRESSION: No evidence of malignancy. The recently suspected left breast
asymmetry represented overlapping of normal fibroglandular tissue.

RECOMMENDATION:
Annual screening mammography beginning at age 40.

I have discussed the findings and recommendations with the patient.
Results were also provided in writing at the conclusion of the
visit. If applicable, a reminder letter will be sent to the patient
regarding the next appointment.

BI-RADS CATEGORY  1: Negative.

## 2016-01-30 ENCOUNTER — Other Ambulatory Visit: Payer: Self-pay | Admitting: Family Medicine

## 2016-01-30 ENCOUNTER — Encounter: Payer: Self-pay | Admitting: Family Medicine

## 2016-01-30 NOTE — Telephone Encounter (Signed)
Electronic refill request. Last Filled:    30 tablet 12 10/29/2014  Last office visit:   05/24/15   Please advise.

## 2016-01-31 NOTE — Telephone Encounter (Signed)
Sent. Thanks.   

## 2016-02-09 NOTE — H&P (Signed)
Patient name [name] [name], [name] DICTATION# [id number] CSN# [id number]  [name],[name], MD 02/09/2016 2:14 PM

## 2016-02-10 NOTE — H&P (Signed)
[name] [name]:  [name], [name]             ACCOUNT NO.:  [id number]  MEDICAL RECORD NO.:  [id number]  LOCATION:                                 FACILITY:  PHYSICIAN:  Juluis MireJohn S. Amarisa [name], M.D.        DATE OF BIRTH:  DATE OF ADMISSION: DATE OF DISCHARGE:                             HISTORY & PHYSICAL   DATE OF SURGERY:  February 20, 2016.  HISTORY OF PRESENT ILLNESS:  The patient is a 40 year old, gravida 3, para 2 female, comes in for removal of IUD, hysteroscopy, NovaSure ablation, and bilateral tubal fulguration.  In relation to present admission, the patient was initially seen in our practice on September 09, 2015.  At that point in time, she was having regular periods every 6 to 7 days of flow, 2 to 3 days being heavy, changing tampons every 2 to 3 hours.  We discussed options and decided to try the IUD, this was inserted in September 2016.  Subsequently, she continued to have abnormal bleeding.  Ultrasound confirmed proper placement of the IUD with no evidence of uterine or endometrial abnormalities.  Because of continued bleeding and problems with the IUD, she has decided proceed with more aggressive therapy.  She will proceed with laparoscopic tubal followed by NovaSure ablation.  ALLERGIES:  She has no known drug allergies.  MEDICATIONS:  She is on Adderall 15 mg twice a day and trazodone 5 mg at night.  PAST SURGICAL HISTORY:  She has had breast augmentation.  OBSTETRICAL HISTORY:  She has had 2 vaginal deliveries and 1 miscarriage.  SOCIAL HISTORY:  Reveals no tobacco use or alcohol use.  FAMILY HISTORY:  Noncontributory.  REVIEW OF SYSTEMS:  Noncontributory.  PHYSICAL EXAMINATION:  VITAL SIGNS:  The patient is afebrile, stable vital signs. HEENT:  The patient is normocephalic.  Pupils are equal, round, and reactive to light and accommodation.  Extraocular movements were intact. Sclerae and conjunctivae were clear.  Oropharynx clear. LUNGS:  Clear. CARDIOVASCULAR:  Regular  rate.  No murmurs or gallops.  No carotid or abdominal bruits. ABDOMEN:  Benign.  No mass, organomegaly, or tenderness. PELVIC:  Normal external genitalia.  Vaginal mucosa is clear.  Cervix unremarkable.  IUD string noted usual size, shape, and contour.  Adnexa free of mass or tenderness. EXTREMITIES:  Trace edema. NEUROLOGIC:  Grossly within normal limits.  IMPRESSION: 1. Menorrhagia with continued abnormal bleeding despite Mirena IUD. 2. Desires sterility.  PLAN:  The patient will undergo laparoscopic bilateral tubal ligation. The potential risks of surgery have been explained including the risk of infection.  Risk of hemorrhage that could require transfusion with the risk of AIDS or hepatitis.  Risk of injury to adjacent organs including bladder or bowel that could require further exploratory surgery.  Risk of deep venous thrombosis and pulmonary embolus.  Potential irreversibility of sterilization explained.  A failure rate of 1 in 200 is quoted.  Failures can be in the form of ectopic pregnancy requiring further surgical management.  In terms of the hysteroscopy and ablation, risks have been discussed including the risk of infection.  The risk of vascular injury that could lead to hemorrhage requiring transfusion with risk of AIDS or hepatitis.  Excessive bleeding that could require hysterectomy.  There is a risk of perforation of injury to adjacent organs, requiring laparoscopy and possible exploratory surgery for repair.  Risk of deep venous thrombosis and pulmonary embolus.  Success rates of approximately 85% are quoted for ablation.  The IUD was also going to be removed.  The patient expressed understanding of indications, risks, and alternatives.     Juluis MireJohn S. [name] [name], M.D.     JSM/MEDQ  D:  02/09/2016  T:  02/10/2016  Job:  [id number]

## 2016-02-13 DIAGNOSIS — N920 Excessive and frequent menstruation with regular cycle: Secondary | ICD-10-CM | POA: Diagnosis not present

## 2016-02-13 DIAGNOSIS — Z302 Encounter for sterilization: Secondary | ICD-10-CM | POA: Diagnosis not present

## 2016-02-16 ENCOUNTER — Encounter (HOSPITAL_BASED_OUTPATIENT_CLINIC_OR_DEPARTMENT_OTHER): Payer: Self-pay | Admitting: *Deleted

## 2016-02-16 NOTE — Progress Notes (Signed)
NPO AFTER MN.  ARRIVE AT 0600. GETTING LAB WORK DONE Friday 02-17-2016 (CBC, hCG SERUM).

## 2016-02-17 DIAGNOSIS — Z79899 Other long term (current) drug therapy: Secondary | ICD-10-CM | POA: Diagnosis not present

## 2016-02-17 DIAGNOSIS — N920 Excessive and frequent menstruation with regular cycle: Secondary | ICD-10-CM | POA: Diagnosis not present

## 2016-02-17 DIAGNOSIS — Z302 Encounter for sterilization: Secondary | ICD-10-CM | POA: Diagnosis not present

## 2016-02-17 DIAGNOSIS — Z87891 Personal history of nicotine dependence: Secondary | ICD-10-CM | POA: Diagnosis not present

## 2016-02-17 LAB — CBC
HCT: 42.9 % (ref 36.0–46.0)
HEMOGLOBIN: 14.5 g/dL (ref 12.0–15.0)
MCH: 30.6 pg (ref 26.0–34.0)
MCHC: 33.8 g/dL (ref 30.0–36.0)
MCV: 90.5 fL (ref 78.0–100.0)
PLATELETS: 304 10*3/uL (ref 150–400)
RBC: 4.74 MIL/uL (ref 3.87–5.11)
RDW: 13.3 % (ref 11.5–15.5)
WBC: 9.3 10*3/uL (ref 4.0–10.5)

## 2016-02-17 LAB — HCG, SERUM, QUALITATIVE: PREG SERUM: NEGATIVE

## 2016-02-19 NOTE — Anesthesia Preprocedure Evaluation (Addendum)
Anesthesia Evaluation  Patient identified by MRN, date of birth, ID band Patient awake    Reviewed: Allergy & Precautions, NPO status , Patient's Chart, lab work & pertinent test results  Airway Mallampati: I  TM Distance: >3 FB Neck ROM: Full    Dental  (+) Dental Advisory Given, Teeth Intact   Pulmonary neg pulmonary ROS, former smoker,    breath sounds clear to auscultation       Cardiovascular Exercise Tolerance: Good negative cardio ROS   Rhythm:Regular Rate:Normal     Neuro/Psych negative neurological ROS     GI/Hepatic negative GI ROS, Neg liver ROS,   Endo/Other  negative endocrine ROS  Renal/GU negative Renal ROS     Musculoskeletal   Abdominal   Peds  Hematology negative hematology ROS (+)   Anesthesia Other Findings   Reproductive/Obstetrics                           Lab Results  Component Value Date   WBC 9.3 02/17/2016   HGB 14.5 02/17/2016   HCT 42.9 02/17/2016   MCV 90.5 02/17/2016   PLT 304 02/17/2016   Lab Results  Component Value Date   CREATININE 0.5 01/08/2008   BUN 10 01/08/2008   NA 138 01/08/2008   K 4.1 01/08/2008   CL 103 01/08/2008   CO2 31 01/08/2008    Anesthesia Physical Anesthesia Plan  ASA: I  Anesthesia Plan: General   Post-op Pain Management:    Induction: Intravenous  Airway Management Planned: Oral ETT  Additional Equipment:   Intra-op Plan:   Post-operative Plan: Extubation in OR  Informed Consent: I have reviewed the patients History and Physical, chart, labs and discussed the procedure including the risks, benefits and alternatives for the proposed anesthesia with the patient or authorized representative who has indicated his/her understanding and acceptance.   Dental advisory given  Plan Discussed with:   Anesthesia Plan Comments:        Anesthesia Quick Evaluation

## 2016-02-20 ENCOUNTER — Ambulatory Visit (HOSPITAL_BASED_OUTPATIENT_CLINIC_OR_DEPARTMENT_OTHER)
Admission: RE | Admit: 2016-02-20 | Discharge: 2016-02-20 | Disposition: A | Discharge status: Home / Self Care | Payer: BLUE CROSS/BLUE SHIELD | Source: Ambulatory Visit | Attending: Obstetrics and Gynecology | Admitting: Obstetrics and Gynecology

## 2016-02-20 ENCOUNTER — Encounter (HOSPITAL_BASED_OUTPATIENT_CLINIC_OR_DEPARTMENT_OTHER): Payer: Self-pay

## 2016-02-20 ENCOUNTER — Ambulatory Visit (HOSPITAL_BASED_OUTPATIENT_CLINIC_OR_DEPARTMENT_OTHER): Payer: BLUE CROSS/BLUE SHIELD | Admitting: Anesthesiology

## 2016-02-20 ENCOUNTER — Encounter (HOSPITAL_BASED_OUTPATIENT_CLINIC_OR_DEPARTMENT_OTHER)
Admission: RE | Disposition: A | Discharge status: Home / Self Care | Payer: Self-pay | Source: Ambulatory Visit | Attending: Obstetrics and Gynecology

## 2016-02-20 DIAGNOSIS — Z302 Encounter for sterilization: Secondary | ICD-10-CM | POA: Diagnosis not present

## 2016-02-20 DIAGNOSIS — Z87891 Personal history of nicotine dependence: Secondary | ICD-10-CM | POA: Insufficient documentation

## 2016-02-20 DIAGNOSIS — N920 Excessive and frequent menstruation with regular cycle: Secondary | ICD-10-CM | POA: Diagnosis not present

## 2016-02-20 DIAGNOSIS — Z79899 Other long term (current) drug therapy: Secondary | ICD-10-CM | POA: Insufficient documentation

## 2016-02-20 DIAGNOSIS — Z30432 Encounter for removal of intrauterine contraceptive device: Secondary | ICD-10-CM | POA: Diagnosis not present

## 2016-02-20 HISTORY — DX: Abnormal uterine and vaginal bleeding, unspecified: N93.9

## 2016-02-20 HISTORY — PX: LAPAROSCOPIC TUBAL LIGATION: SHX1937

## 2016-02-20 HISTORY — DX: Presence of spectacles and contact lenses: Z97.3

## 2016-02-20 HISTORY — PX: DILITATION & CURRETTAGE/HYSTROSCOPY WITH NOVASURE ABLATION: SHX5568

## 2016-02-20 HISTORY — DX: Attention-deficit hyperactivity disorder, unspecified type: F90.9

## 2016-02-20 HISTORY — PX: IUD REMOVAL: SHX5392

## 2016-02-20 HISTORY — DX: Excessive and frequent menstruation with regular cycle: N92.0

## 2016-02-20 LAB — POCT PREGNANCY, URINE: Preg Test, Ur: NEGATIVE

## 2016-02-20 SURGERY — LIGATION, FALLOPIAN TUBE, LAPAROSCOPIC
Anesthesia: General

## 2016-02-20 MED ORDER — MIDAZOLAM HCL 5 MG/5ML IJ SOLN
INTRAMUSCULAR | Status: DC | PRN
  Administered 2016-02-20: 2 mg via INTRAVENOUS

## 2016-02-20 MED ORDER — PROPOFOL 10 MG/ML IV BOLUS
INTRAVENOUS | Status: DC | PRN
  Administered 2016-02-20: 120 mg via INTRAVENOUS

## 2016-02-20 MED ORDER — ROCURONIUM BROMIDE 10 MG/ML (PF) SYRINGE
PREFILLED_SYRINGE | INTRAVENOUS | Status: DC | PRN
  Administered 2016-02-20: 30 mg via INTRAVENOUS

## 2016-02-20 MED ORDER — SUGAMMADEX SODIUM 200 MG/2ML IV SOLN
INTRAVENOUS | Status: DC | PRN
  Administered 2016-02-20: 130 mg via INTRAVENOUS

## 2016-02-20 MED ORDER — ONDANSETRON HCL 4 MG/2ML IJ SOLN
INTRAMUSCULAR | Status: AC
  Filled 2016-02-20: qty 2

## 2016-02-20 MED ORDER — ONDANSETRON HCL 4 MG/2ML IJ SOLN
INTRAMUSCULAR | Status: DC | PRN
  Administered 2016-02-20: 4 mg via INTRAVENOUS

## 2016-02-20 MED ORDER — DEXAMETHASONE SODIUM PHOSPHATE 10 MG/ML IJ SOLN
INTRAMUSCULAR | Status: AC
  Filled 2016-02-20: qty 1

## 2016-02-20 MED ORDER — LIDOCAINE 2% (20 MG/ML) 5 ML SYRINGE
INTRAMUSCULAR | Status: DC | PRN
  Administered 2016-02-20: 65 mg via INTRAVENOUS

## 2016-02-20 MED ORDER — DEXAMETHASONE SODIUM PHOSPHATE 4 MG/ML IJ SOLN
INTRAMUSCULAR | Status: DC | PRN
  Administered 2016-02-20: 10 mg via INTRAVENOUS

## 2016-02-20 MED ORDER — MIDAZOLAM HCL 2 MG/2ML IJ SOLN
INTRAMUSCULAR | Status: AC
  Filled 2016-02-20: qty 2

## 2016-02-20 MED ORDER — ARTIFICIAL TEARS OP OINT
TOPICAL_OINTMENT | OPHTHALMIC | Status: AC
  Filled 2016-02-20: qty 3.5

## 2016-02-20 MED ORDER — KETOROLAC TROMETHAMINE 30 MG/ML IJ SOLN
30.0000 mg | Freq: Once | INTRAMUSCULAR | Status: AC | PRN
  Administered 2016-02-20: 30 mg via INTRAVENOUS
  Filled 2016-02-20: qty 1

## 2016-02-20 MED ORDER — PROPOFOL 10 MG/ML IV BOLUS
INTRAVENOUS | Status: AC
  Filled 2016-02-20: qty 40

## 2016-02-20 MED ORDER — LACTATED RINGERS IV SOLN
INTRAVENOUS | Status: DC
  Administered 2016-02-20 (×2): via INTRAVENOUS
  Filled 2016-02-20: qty 1000

## 2016-02-20 MED ORDER — CEFAZOLIN SODIUM-DEXTROSE 2-4 GM/100ML-% IV SOLN
INTRAVENOUS | Status: AC
  Filled 2016-02-20: qty 100

## 2016-02-20 MED ORDER — SUGAMMADEX SODIUM 200 MG/2ML IV SOLN
INTRAVENOUS | Status: AC
  Filled 2016-02-20: qty 2

## 2016-02-20 MED ORDER — ROCURONIUM BROMIDE 50 MG/5ML IV SOSY
PREFILLED_SYRINGE | INTRAVENOUS | Status: AC
  Filled 2016-02-20: qty 5

## 2016-02-20 MED ORDER — BUPIVACAINE HCL (PF) 0.5 % IJ SOLN
INTRAMUSCULAR | Status: DC | PRN
  Administered 2016-02-20: 3 mL

## 2016-02-20 MED ORDER — CHLOROPROCAINE HCL 1 % IJ SOLN
INTRAMUSCULAR | Status: DC | PRN
  Administered 2016-02-20: 20 mL

## 2016-02-20 MED ORDER — CEFAZOLIN SODIUM-DEXTROSE 2-4 GM/100ML-% IV SOLN
2.0000 g | INTRAVENOUS | Status: AC
  Administered 2016-02-20: 2 g via INTRAVENOUS
  Filled 2016-02-20: qty 100

## 2016-02-20 MED ORDER — HYDROMORPHONE HCL 1 MG/ML IJ SOLN
0.2500 mg | INTRAMUSCULAR | Status: DC | PRN
  Filled 2016-02-20: qty 0.5

## 2016-02-20 MED ORDER — OXYCODONE-ACETAMINOPHEN 7.5-325 MG PO TABS: 1.0000 | ORAL_TABLET | ORAL | 0 refills | Status: DC | PRN

## 2016-02-20 MED ORDER — FENTANYL CITRATE (PF) 100 MCG/2ML IJ SOLN
INTRAMUSCULAR | Status: AC
  Filled 2016-02-20: qty 4

## 2016-02-20 MED ORDER — SODIUM CHLORIDE 0.9 % IR SOLN
Status: DC | PRN
  Administered 2016-02-20: 300 mL

## 2016-02-20 MED ORDER — SCOPOLAMINE 1 MG/3DAYS TD PT72
1.0000 | MEDICATED_PATCH | TRANSDERMAL | Status: DC
  Administered 2016-02-20: 1.5 mg via TRANSDERMAL
  Administered 2016-02-20: 1 via TRANSDERMAL
  Filled 2016-02-20: qty 1

## 2016-02-20 MED ORDER — SCOPOLAMINE 1 MG/3DAYS TD PT72
MEDICATED_PATCH | TRANSDERMAL | Status: AC
  Filled 2016-02-20: qty 1

## 2016-02-20 MED ORDER — LIDOCAINE 2% (20 MG/ML) 5 ML SYRINGE
INTRAMUSCULAR | Status: AC
  Filled 2016-02-20: qty 5

## 2016-02-20 MED ORDER — PROMETHAZINE HCL 25 MG/ML IJ SOLN
6.2500 mg | INTRAMUSCULAR | Status: DC | PRN
  Filled 2016-02-20: qty 1

## 2016-02-20 MED ORDER — FENTANYL CITRATE (PF) 100 MCG/2ML IJ SOLN
INTRAMUSCULAR | Status: DC | PRN
  Administered 2016-02-20 (×2): 100 ug via INTRAVENOUS

## 2016-02-20 MED ORDER — KETOROLAC TROMETHAMINE 30 MG/ML IJ SOLN
INTRAMUSCULAR | Status: AC
  Filled 2016-02-20: qty 1

## 2016-02-20 SURGICAL SUPPLY — 60 items
ABLATOR ENDOMETRIAL BIPOLAR (ABLATOR) ×3 IMPLANT
APPLICATOR COTTON TIP 6IN STRL (MISCELLANEOUS) ×3 IMPLANT
BANDAGE ADHESIVE 1X3 (GAUZE/BANDAGES/DRESSINGS) IMPLANT
BIPOLAR CUTTING LOOP 21FR (ELECTRODE)
BLADE SURG 11 STRL SS (BLADE) ×3 IMPLANT
CANISTER SUCTION 1200CC (MISCELLANEOUS) IMPLANT
CANISTER SUCTION 2500CC (MISCELLANEOUS) ×3 IMPLANT
CATH ROBINSON RED A/P 16FR (CATHETERS) ×3 IMPLANT
COVER BACK TABLE 60X90IN (DRAPES) ×3 IMPLANT
COVER MAYO STAND STRL (DRAPES) ×3 IMPLANT
DRAPE LG THREE QUARTER DISP (DRAPES) ×3 IMPLANT
DRAPE UNDERBUTTOCKS STRL (DRAPE) ×3 IMPLANT
DRSG TELFA 3X8 NADH (GAUZE/BANDAGES/DRESSINGS) ×3 IMPLANT
ELECT REM PT RETURN 9FT ADLT (ELECTROSURGICAL) ×3
ELECTRODE REM PT RTRN 9FT ADLT (ELECTROSURGICAL) ×2 IMPLANT
FILTER SMOKE EVAC LAPAROSHD (FILTER) IMPLANT
GAUZE VASELINE 1X8 (GAUZE/BANDAGES/DRESSINGS) IMPLANT
GLOVE BIO SURGEON STRL SZ7 (GLOVE) ×6 IMPLANT
GOWN STRL REUS W/ TWL LRG LVL3 (GOWN DISPOSABLE) ×4 IMPLANT
GOWN STRL REUS W/TWL LRG LVL3 (GOWN DISPOSABLE) ×2
GOWN STRL REUS W/TWL XL LVL3 (GOWN DISPOSABLE) ×6 IMPLANT
KIT ROOM TURNOVER WOR (KITS) ×3 IMPLANT
LEGGING LITHOTOMY PAIR STRL (DRAPES) ×3 IMPLANT
LIQUID BAND (GAUZE/BANDAGES/DRESSINGS) ×3 IMPLANT
LOOP CUTTING BIPOLAR 21FR (ELECTRODE) IMPLANT
MANIFOLD NEPTUNE II (INSTRUMENTS) IMPLANT
NEEDLE HYPO 25X1 1.5 SAFETY (NEEDLE) ×3 IMPLANT
NEEDLE INSUFFLATION 14GA 120MM (NEEDLE) IMPLANT
NEEDLE SPNL 18GX3.5 QUINCKE PK (NEEDLE) ×3 IMPLANT
NS IRRIG 500ML POUR BTL (IV SOLUTION) ×3 IMPLANT
PACK BASIN DAY SURGERY FS (CUSTOM PROCEDURE TRAY) ×3 IMPLANT
PACK LAPAROSCOPY II (CUSTOM PROCEDURE TRAY) ×3 IMPLANT
PAD ABD 8X10 STRL (GAUZE/BANDAGES/DRESSINGS) ×3 IMPLANT
PAD OB MATERNITY 4.3X12.25 (PERSONAL CARE ITEMS) ×3 IMPLANT
PAD PREP 24X48 CUFFED NSTRL (MISCELLANEOUS) ×3 IMPLANT
POUCH SPECIMEN RETRIEVAL 10MM (ENDOMECHANICALS) IMPLANT
SCISSORS LAP 5X35 DISP (ENDOMECHANICALS) IMPLANT
SCISSORS LAP 5X45 EPIX DISP (ENDOMECHANICALS) IMPLANT
SEALER TISSUE G2 CVD JAW 35 (ENDOMECHANICALS) IMPLANT
SEALER TISSUE G2 CVD JAW 45CM (ENDOMECHANICALS) IMPLANT
SET IRRIG TUBING LAPAROSCOPIC (IRRIGATION / IRRIGATOR) IMPLANT
SOLUTION ANTI FOG 6CC (MISCELLANEOUS) ×3 IMPLANT
SUT VIC AB 3-0 PS2 18 (SUTURE) ×1
SUT VIC AB 3-0 PS2 18XBRD (SUTURE) ×2 IMPLANT
SUT VICRYL 0 ENDOLOOP (SUTURE) IMPLANT
SUT VICRYL 0 UR6 27IN ABS (SUTURE) IMPLANT
SUT VICRYL 4-0 PS2 18IN ABS (SUTURE) IMPLANT
SYR CONTROL 10ML LL (SYRINGE) ×3 IMPLANT
SYRINGE 10CC LL (SYRINGE) IMPLANT
TOWEL OR 17X24 6PK STRL BLUE (TOWEL DISPOSABLE) ×6 IMPLANT
TRAY DSU PREP LF (CUSTOM PROCEDURE TRAY) ×3 IMPLANT
TROCAR BALLN 12MMX100 BLUNT (TROCAR) IMPLANT
TROCAR OPTI TIP 5M 100M (ENDOMECHANICALS) ×3 IMPLANT
TROCAR XCEL DIL TIP R 11M (ENDOMECHANICALS) IMPLANT
TUBE CONNECTING 12X1/4 (SUCTIONS) IMPLANT
TUBING AQUILEX INFLOW (TUBING) ×3 IMPLANT
TUBING AQUILEX OUTFLOW (TUBING) ×3 IMPLANT
TUBING INSUFFLATION 10FT LAP (TUBING) ×3 IMPLANT
WARMER LAPAROSCOPE (MISCELLANEOUS) ×3 IMPLANT
WATER STERILE IRR 500ML POUR (IV SOLUTION) ×3 IMPLANT

## 2016-02-20 NOTE — H&P (Signed)
  History and physical exam unchanged 

## 2016-02-20 NOTE — Anesthesia Procedure Notes (Signed)
Procedure Name: Intubation Date/Time: 02/20/2016 7:35 AM Performed by: Tyrone NineSAUVE, Ahmon Tosi F Pre-anesthesia Checklist: Patient identified, Timeout performed, Emergency Drugs available, Suction available and Patient being monitored Patient Re-evaluated:Patient Re-evaluated prior to inductionOxygen Delivery Method: Circle system utilized Preoxygenation: Pre-oxygenation with 100% oxygen Intubation Type: IV induction Ventilation: Mask ventilation without difficulty Laryngoscope Size: Mac and 3 Grade View: Grade I Tube type: Oral Number of attempts: 1 Airway Equipment and Method: Stylet Placement Confirmation: positive ETCO2 and ETT inserted through vocal cords under direct vision Secured at: 20 cm Dental Injury: Teeth and Oropharynx as per pre-operative assessment

## 2016-02-20 NOTE — Brief Op Note (Signed)
02/20/2016  8:21 AM  PATIENT:  [name] [name]  40 y.o. female  PRE-OPERATIVE DIAGNOSIS:  desires sterility, menorrhagia  POST-OPERATIVE DIAGNOSIS:  desires sterility, menorrhagia  PROCEDURE:  Procedure(s): LAPAROSCOPIC TUBAL LIGATION (Bilateral) DILATATION & CURETTAGE/HYSTEROSCOPY WITH NOVASURE ABLATION (N/A) INTRAUTERINE DEVICE (IUD) REMOVAL (N/A)  SURGEON:  Surgeon(s) and Role:    * Richardean ChimeraJohn Pat Sires, MD - Primary  PHYSICIAN ASSISTANT:   ASSISTANTS: none   ANESTHESIA:   local and general  EBL:  No intake/output data recorded.  BLOOD ADMINISTERED:none  DRAINS: none   LOCAL MEDICATIONS USED:  MARCAINE    and XYLOCAINE   SPECIMEN:  Source of Specimen:  endometrial currettings  DISPOSITION OF SPECIMEN:  PATHOLOGY  COUNTS:  YES  TOURNIQUET:  * No tourniquets in log *  DICTATION: .Other Dictation: Dictation Number [id number]  PLAN OF CARE: Discharge to home after PACU  PATIENT DISPOSITION:  PACU - hemodynamically stable.   Delay start of Pharmacological VTE agent (>24hrs) due to surgical blood loss or risk of bleeding: not applicable

## 2016-02-20 NOTE — Discharge Instructions (Signed)

## 2016-02-20 NOTE — Anesthesia Postprocedure Evaluation (Signed)
Anesthesia Post Note  Patient: [name] [name]  Procedure(s) Performed: Procedure(s) (LRB): LAPAROSCOPIC TUBAL LIGATION (Bilateral) DILATATION & CURETTAGE/HYSTEROSCOPY WITH NOVASURE ABLATION (N/A) INTRAUTERINE DEVICE (IUD) REMOVAL (N/A)  Patient location during evaluation: PACU Anesthesia Type: General Level of consciousness: awake and alert Pain management: pain level controlled Vital Signs Assessment: post-procedure vital signs reviewed and stable Respiratory status: spontaneous breathing, nonlabored ventilation, respiratory function stable and patient connected to nasal cannula oxygen Cardiovascular status: blood pressure returned to baseline and stable Postop Assessment: no signs of nausea or vomiting Anesthetic complications: no    Last Vitals:  Vitals:   02/20/16 0832 02/20/16 0957  BP: (!) 134/93 99/64  Pulse: 90 68  Resp: 16 16  Temp: 36.3 C 36.9 C    Last Pain:  Vitals:   02/20/16 0957  TempSrc: Tympanic                 Kennieth RadFitzgerald, [name]

## 2016-02-20 NOTE — Transfer of Care (Signed)
Immediate Anesthesia Transfer of Care Note  Patient: [name] NegusJennifer M [name]  Procedure(s) Performed: Procedure(s): LAPAROSCOPIC TUBAL LIGATION (Bilateral) DILATATION & CURETTAGE/HYSTEROSCOPY WITH NOVASURE ABLATION (N/A) INTRAUTERINE DEVICE (IUD) REMOVAL (N/A)  Patient Location: PACU  Anesthesia Type:General  Level of Consciousness: awake, alert , oriented and patient cooperative  Airway & Oxygen Therapy: Patient Spontanous Breathing and Patient connected to nasal cannula oxygen  Post-op Assessment: Report given to RN and Post -op Vital signs reviewed and stable  Post vital signs: Reviewed and stable  Last Vitals:  Vitals:   02/20/16 0617 02/20/16 0832  BP: 114/69 (!) 134/93  Pulse: 91 90  Resp: 16 16  Temp: 36.9 C 36.3 C    Last Pain:  Vitals:   02/20/16 0617  TempSrc: Oral      Patients Stated Pain Goal: 5 (02/20/16 0713)  Complications: No apparent anesthesia complications

## 2016-02-21 ENCOUNTER — Encounter (HOSPITAL_BASED_OUTPATIENT_CLINIC_OR_DEPARTMENT_OTHER): Payer: Self-pay | Admitting: Obstetrics and Gynecology

## 2016-02-21 NOTE — Op Note (Signed)
[name] [name]:  [name], [name]             ACCOUNT NO.:  [id number]  MEDICAL RECORD NO.:  [id number]  LOCATION:                                 FACILITY:  PHYSICIAN:  Juluis MireJohn S. Eurika Sandy, M.D.   DATE OF BIRTH:  05/31/1974  DATE OF PROCEDURE:  02/20/2016 DATE OF DISCHARGE:                              OPERATIVE REPORT   PREOPERATIVE DIAGNOSES:  Abnormal uterine bleeding in the form of menorrhagia unresponsive to intrauterine device, which is in place. Multipara, desires sterility.  POSTOPERATIVE DIAGNOSES:  Abnormal uterine bleeding in the form of menorrhagia unresponsive to intrauterine device, which is in place. Multipara, desires sterility.  OPERATIVE PROCEDURE:  Removal of Mirena IUD intact.  Cervical dilation, hysteroscopy with endometrial sampling.  NovaSure ablation. Laparoscopic bilateral tubal ligation.  SURGEON:  Juluis MireJohn S. Torria Fromer, M.D.  ANESTHESIA:  General.  ESTIMATED BLOOD LOSS:  Minimal.  PACKS:  None.  DRAINS:  None.  INTRAOPERATIVE BLOOD PLACED:  None.  COMPLICATIONS:  None.  INDICATION:  Dictated in history and physical.  PROCEDURES IN DETAIL:  The patient was taken to the OR and placed in supine position.  After satisfactory level of general endotracheal anesthesia was obtained, the patient was placed in dorsal supine position using the Allen stirrups.  At this point in time, the abdomen, perineum, vagina prepped out with Betadine and draped in sterile field. A speculum was placed in the vaginal vault.  The cervix was grasped with single-tooth tenaculum.  A paracervical block, 1% Nesacaine was then instituted.  Uterus sounded to 11 cm, endocervical length was 5, given a cavity length of 6 cm.  Hysteroscopy was then performed.  Endometrial cavity was unremarkable.  There were no polyps or abnormalities. Endometrial curettings were obtained and sent for pathological review. The NovaSure was then brought out, put in place.  We had a cavity width of 4.6 cm.  We  passed the CO2 patency test.  Ablation was taken at a power level of 127 watts for 1 minute and 42 seconds.  The NovaSure was then removed intact.  Repeat hysteroscopy revealed complete endometrial ablation.  There were no other areas of abnormalities, no signs of perforation.  The hysteroscope was then removed.  The Hulka tenaculum was put in place.  __________ been removed.  Bladder was emptied by in- and-out catheterization.  Legs were repositioned.  Next, a subumbilical incision was made with a knife.  Veress needle was introduced into the abdominal cavity without difficulty.  The abdomen was inflated with approximately 3.5 L of carbon dioxide.  Trocar was then inserted.  Laparoscope was introduced.  There was no evidence of injury to adjacent organs.  Uterus was elevated.  Uterus was intact.  It was slightly enlarged.  Both ovaries were unremarkable.  Both tubes were unremarkable.  In the cul-de-sac, she did have some implants of endometriosis.  I could not see the appendix, it must have been retrocecal.  The upper abdomen including liver tip of the gallbladder were clear.  Using the bipolar, we first went to the right side, a 3 cm segment of the right fallopian tube was cauterized until resistance read 0.  We then recauterized the same area completely desiccating the tube.  We then went to the left side, a 3-cm segment of that tube was cauterized until resistance read 0.  The same segment tube was then re- cauterized completely desiccating the tube __________ 3 cm segment of tube cauterized with no evidence of injury to adjacent organs.  I then went to the cul-de-sac, areas of endometriosis were cauterized using the bipolar.  At this point in time, we were complete.  There were no signs __________ adjacent organs.  Both tubes were adequately coagulated.  At this point in time, the abdomen was deflated with carbon dioxide. Trocars removed.  Subumbilical incision was closed with an  interrupted subcuticulars of 4-0 Vicryl.  The Hulka tenaculum was then removed.  The patient taken out of the dorsal lithotomy position.  Once alert and extubated, transferred to recovery room in good condition.  Sponge, instrument, and needle count were correct by circulating nurse x2.     Juluis MireJohn S. Nava Song, M.D.     JSM/MEDQ  D:  02/20/2016  T:  02/21/2016  Job:  [id number]

## 2016-03-14 ENCOUNTER — Other Ambulatory Visit: Payer: Self-pay | Admitting: Family Medicine

## 2016-03-14 MED ORDER — AMPHETAMINE-DEXTROAMPHETAMINE 15 MG PO TABS
15.0000 mg | ORAL_TABLET | Freq: Two times a day (BID) | ORAL | 0 refills | Status: DC
Start: 2016-03-14 — End: 2016-04-16

## 2016-03-14 NOTE — Telephone Encounter (Signed)
Patient notified by telephone that script is up front ready for pickup. Patient stated that she will schedule her appointment when she comes in to pick the script up.

## 2016-03-14 NOTE — Telephone Encounter (Signed)
Last refill 12/14/15 #60 Last office visit 05/24/15

## 2016-03-14 NOTE — Telephone Encounter (Signed)
Printed.  Due for OV this spring.  Thanks.

## 2016-04-16 ENCOUNTER — Other Ambulatory Visit: Payer: Self-pay | Admitting: Family Medicine

## 2016-04-16 NOTE — Telephone Encounter (Signed)
Last office visit 05/24/2015.  Last refilled 03/14/2016 for #60 with no refills.  Ok to refill?

## 2016-04-17 MED ORDER — AMPHETAMINE-DEXTROAMPHETAMINE 15 MG PO TABS: 15.0000 mg | ORAL_TABLET | Freq: Two times a day (BID) | ORAL | 0 refills | Status: DC

## 2016-04-17 MED ORDER — AMPHETAMINE-DEXTROAMPHETAMINE 15 MG PO TABS: 15.0000 mg | ORAL_TABLET | Freq: Every day | ORAL | 0 refills | Status: DC

## 2016-04-17 NOTE — Telephone Encounter (Signed)
Initial rx printed incorrectly, corrected and reprinted.  Thanks.  Old rx shredded.  R Laws witnessed.

## 2016-04-17 NOTE — Telephone Encounter (Signed)
Patient notified by telephone that scripts are up front ready for pickup. 

## 2016-04-17 NOTE — Telephone Encounter (Signed)
3 rxs done.  Needs ADD f/u scheduled, if not already done. Thanks.

## 2016-06-12 ENCOUNTER — Encounter: Payer: Self-pay | Admitting: Family Medicine

## 2016-06-12 ENCOUNTER — Ambulatory Visit (INDEPENDENT_AMBULATORY_CARE_PROVIDER_SITE_OTHER): Payer: BLUE CROSS/BLUE SHIELD | Admitting: Family Medicine

## 2016-06-12 DIAGNOSIS — F988 Other specified behavioral and emotional disorders with onset usually occurring in childhood and adolescence: Secondary | ICD-10-CM | POA: Diagnosis not present

## 2016-06-12 NOTE — Assessment & Plan Note (Signed)
Continue as is. See above.  Rare use of trazodone, sleeping well o/w.  No ADE on med o/w.  Okay to continue current dose.  D/w pt.  Update me as needed.  She agrees.

## 2016-06-12 NOTE — Progress Notes (Signed)
She has some tingling in the R hand with using hair dryer or similar tools.  This is better than prev.  No persistent sx o/w.  She'll update me as needed.  She prev used a brace with minimal or no relief.    S/p BTL.  No complications.    Safe at home.    ADD.  Manageable with meds.  She needs rx mainly for work, not using med much on the weekend.  Usually 1 dose in the AM, 1 around lunch.  No tremor, no ADE.  Appetite okay.  Diet and exercise d/w pt, encouraged.  Rare use of prn trazodone.  She is careful to use stimulant not late in the day.    She feels well o/w.    Meds, vitals, and allergies reviewed.   ROS: Per HPI unless specifically indicated in ROS section   GEN: nad, alert and oriented HEENT: mucous membranes moist NECK: supple w/o LA CV: rrr.  no murmur PULM: ctab, no inc wob ABD: soft, +bs EXT: no edema CN 2-12 wnl B, S/S/DTR wnl B

## 2016-06-12 NOTE — Patient Instructions (Addendum)
Take care.  Glad to see you.  Update me as needed.  Let us know when you need refills.

## 2016-07-13 ENCOUNTER — Telehealth: Payer: Self-pay | Admitting: Family Medicine

## 2016-07-13 NOTE — Telephone Encounter (Signed)
Last office visit 06/12/2016.  Last refilled 04/17/2016 for #60 with no refills. Ok to refill?

## 2016-07-15 MED ORDER — AMPHETAMINE-DEXTROAMPHETAMINE 15 MG PO TABS
15.0000 mg | ORAL_TABLET | Freq: Two times a day (BID) | ORAL | 0 refills | Status: DC
Start: 2016-07-15 — End: 2016-10-10

## 2016-07-15 MED ORDER — AMPHETAMINE-DEXTROAMPHETAMINE 15 MG PO TABS: 15.0000 mg | ORAL_TABLET | Freq: Two times a day (BID) | ORAL | 0 refills | Status: DC

## 2016-07-15 NOTE — Telephone Encounter (Signed)
Printed x3.  Thanks.  

## 2016-07-16 ENCOUNTER — Other Ambulatory Visit: Payer: Self-pay | Admitting: Family Medicine

## 2016-07-16 NOTE — Telephone Encounter (Signed)
Patient advised.  Rx left at front desk for pick up. 

## 2016-10-10 ENCOUNTER — Other Ambulatory Visit: Payer: Self-pay | Admitting: Family Medicine

## 2016-10-10 NOTE — Telephone Encounter (Signed)
Last filled 07/15/16 3 Rx for 3 months-- please advise

## 2016-10-11 MED ORDER — AMPHETAMINE-DEXTROAMPHETAMINE 15 MG PO TABS: 15.0000 mg | ORAL_TABLET | Freq: Two times a day (BID) | ORAL | 0 refills | Status: DC

## 2016-10-11 NOTE — Telephone Encounter (Signed)
Patient advised.  Rx left at front desk for pick up. 

## 2016-10-11 NOTE — Telephone Encounter (Signed)
Printed.  Thanks.  

## 2017-01-07 ENCOUNTER — Other Ambulatory Visit: Payer: Self-pay | Admitting: Family Medicine

## 2017-01-07 NOTE — Telephone Encounter (Signed)
Last refill 10/11/16 to be filled after 12/10/16 #60 Last office visit 06/12/16

## 2017-01-08 MED ORDER — AMPHETAMINE-DEXTROAMPHETAMINE 15 MG PO TABS: 15.0000 mg | ORAL_TABLET | Freq: Two times a day (BID) | ORAL | 0 refills | Status: DC

## 2017-01-08 MED ORDER — AMPHETAMINE-DEXTROAMPHETAMINE 15 MG PO TABS
15.0000 mg | ORAL_TABLET | Freq: Two times a day (BID) | ORAL | 0 refills | Status: DC
Start: 2017-01-08 — End: 2017-04-07

## 2017-01-08 NOTE — Telephone Encounter (Signed)
Left detailed message on voicemail. Rx left at front desk for pick up.  

## 2017-01-08 NOTE — Telephone Encounter (Signed)
Printed 3 rxs.  Thanks.

## 2017-04-05 ENCOUNTER — Other Ambulatory Visit: Payer: Self-pay | Admitting: Family Medicine

## 2017-04-05 NOTE — Telephone Encounter (Signed)
Electronic refill request. Adderall Last office visit:   06/12/2016 Last Filled:   03/09/2017 Please advise.

## 2017-04-07 MED ORDER — AMPHETAMINE-DEXTROAMPHETAMINE 15 MG PO TABS: 15.0000 mg | ORAL_TABLET | Freq: Two times a day (BID) | ORAL | 0 refills | Status: DC

## 2017-04-07 NOTE — Telephone Encounter (Signed)
3 rxs sent . Needs yearly visit in 06/2017.  Thanks.

## 2017-04-08 ENCOUNTER — Encounter: Payer: Self-pay | Admitting: *Deleted

## 2017-04-08 NOTE — Telephone Encounter (Signed)
Letter mailed

## 2017-05-03 ENCOUNTER — Other Ambulatory Visit (INDEPENDENT_AMBULATORY_CARE_PROVIDER_SITE_OTHER): Payer: BLUE CROSS/BLUE SHIELD

## 2017-05-03 ENCOUNTER — Other Ambulatory Visit: Payer: Self-pay | Admitting: Family Medicine

## 2017-05-03 ENCOUNTER — Encounter: Payer: Self-pay | Admitting: Family Medicine

## 2017-05-03 DIAGNOSIS — Z131 Encounter for screening for diabetes mellitus: Secondary | ICD-10-CM | POA: Diagnosis not present

## 2017-05-03 LAB — GLUCOSE, RANDOM: GLUCOSE: 84 mg/dL (ref 70–99)

## 2017-05-06 ENCOUNTER — Ambulatory Visit (INDEPENDENT_AMBULATORY_CARE_PROVIDER_SITE_OTHER): Payer: BLUE CROSS/BLUE SHIELD | Admitting: Family Medicine

## 2017-05-06 ENCOUNTER — Encounter: Payer: Self-pay | Admitting: Family Medicine

## 2017-05-06 VITALS — BP 100/62 | HR 86 | Temp 98.6°F | Ht 61.5 in | Wt 124.0 lb

## 2017-05-06 DIAGNOSIS — F988 Other specified behavioral and emotional disorders with onset usually occurring in childhood and adolescence: Secondary | ICD-10-CM

## 2017-05-06 DIAGNOSIS — Z0000 Encounter for general adult medical examination without abnormal findings: Secondary | ICD-10-CM

## 2017-05-06 DIAGNOSIS — Z7189 Other specified counseling: Secondary | ICD-10-CM

## 2017-05-06 DIAGNOSIS — G4700 Insomnia, unspecified: Secondary | ICD-10-CM

## 2017-05-06 MED ORDER — TRAZODONE HCL 50 MG PO TABS: ORAL_TABLET | ORAL | 12 refills | Status: DC

## 2017-05-06 MED ORDER — AMPHETAMINE-DEXTROAMPHETAMINE 15 MG PO TABS: 15.0000 mg | ORAL_TABLET | Freq: Two times a day (BID) | ORAL | 0 refills | Status: DC

## 2017-05-06 NOTE — Patient Instructions (Addendum)
[facility] [address] [place] [phone]  Don't change your meds.  I would get a flu shot each fall.   Work on diet and exercise.  Update me as needed.  Take care.  Glad to see you.

## 2017-05-06 NOTE — Progress Notes (Signed)
CPE- See plan.  Routine anticipatory guidance given to patient.  See health maintenance.  The possibility exists that previously documented standard health maintenance information may have been brought forward from a previous encounter into this note.  If needed, that same information has been updated to reflect the current situation based on today's encounter.    Tetanus 2015  PNA and shingles not due  Flu shot encouraged. Declined.   Colon cancer screening not due.  Mammogram 2016, see AVS.  DXA not due.  Pap due this 2019, d/w pt.  Living will d/w pt. Would have her mother designated if patient were incapacitated.  Glucose wnl, d/w pt.   Diet and exercise d/w pt, both encouraged, both affected by work.  Exercise- she is considering options.    ADD.  Noted effect from med. Not used on weekend usually.  No ADE on med. No tremor.  She doesn't have sig onset/offset sx.    Insomnia.  Trazodone helps, no ADE on med.  Used prn.    PMH and SH reviewed  Meds, vitals, and allergies reviewed.   ROS: Per HPI.  Unless specifically indicated otherwise in HPI, the patient denies:  General: fever. Eyes: acute vision changes ENT: sore throat Cardiovascular: chest pain Respiratory: SOB GI: vomiting GU: dysuria Musculoskeletal: acute back pain Derm: acute rash Neuro: acute motor dysfunction Psych: worsening mood Endocrine: polydipsia Heme: bleeding Allergy: hayfever  GEN: nad, alert and oriented HEENT: mucous membranes moist NECK: supple w/o LA CV: rrr. PULM: ctab, no inc wob ABD: soft, +bs EXT: no edema SKIN: no acute rash

## 2017-05-07 NOTE — Assessment & Plan Note (Signed)
Tetanus 2015  PNA and shingles not due  Flu shot encouraged. Declined.   Colon cancer screening not due.  Mammogram 2016, see AVS.  DXA not due.  Pap due this 2019, d/w pt.  Living will d/w pt. Would have her mother designated if patient were incapacitated.  Glucose wnl, d/w pt.   Diet and exercise d/w pt, both encouraged, both affected by work.  Exercise- she is considering options.

## 2017-05-07 NOTE — Assessment & Plan Note (Deleted)
Controlled with medication.  Good effect from medication.  No adverse effect.  No tremor.  Continue as is. 

## 2017-05-07 NOTE — Assessment & Plan Note (Signed)
Controlled with medication.  Good effect from medication.  No adverse effect.  No tremor.  Continue as is.

## 2017-05-07 NOTE — Assessment & Plan Note (Signed)
Living will d/w pt.  Would have her mother designated if patient were incapacitated.   

## 2017-05-07 NOTE — Assessment & Plan Note (Signed)
Controlled with trazodone.  No adverse effect.  Continue as is.  She agrees.

## 2017-07-29 ENCOUNTER — Other Ambulatory Visit: Payer: Self-pay | Admitting: Family Medicine

## 2017-07-30 NOTE — Telephone Encounter (Signed)
Last office visit 05/06/17 Last refill 05/06/17- 3 scripts given that date

## 2017-07-31 MED ORDER — AMPHETAMINE-DEXTROAMPHETAMINE 15 MG PO TABS: 15.0000 mg | ORAL_TABLET | Freq: Two times a day (BID) | ORAL | 0 refills | Status: DC

## 2017-07-31 NOTE — Telephone Encounter (Signed)
Sent. Thanks.   

## 2017-10-30 ENCOUNTER — Other Ambulatory Visit: Payer: Self-pay | Admitting: Family Medicine

## 2017-10-30 NOTE — Telephone Encounter (Signed)
Name of Medication: Adderall [facility] of [facility] [address] [place] Date and Quantity: 07/31/17 #60 with 2 refills sent in Last Office Visit and Type:05/06/17 CPE Next Office Visit and Type: none scheduled Last Controlled Substance Agreement Date: none Last WUJ:WJXBDS:none

## 2017-10-31 MED ORDER — AMPHETAMINE-DEXTROAMPHETAMINE 15 MG PO TABS: 15.0000 mg | ORAL_TABLET | Freq: Two times a day (BID) | ORAL | 0 refills | Status: DC

## 2017-10-31 NOTE — Telephone Encounter (Signed)
Sent. Thanks.   

## 2018-01-27 ENCOUNTER — Other Ambulatory Visit: Payer: Self-pay | Admitting: Family Medicine

## 2018-01-27 NOTE — Telephone Encounter (Signed)
Electronic refill request. Adderall Last office visit:   05/06/17 Last Filled:   10/31/17 Please advise.

## 2018-01-28 DIAGNOSIS — Z6823 Body mass index (BMI) 23.0-23.9, adult: Secondary | ICD-10-CM | POA: Diagnosis not present

## 2018-01-28 DIAGNOSIS — Z1231 Encounter for screening mammogram for malignant neoplasm of breast: Secondary | ICD-10-CM | POA: Diagnosis not present

## 2018-01-28 DIAGNOSIS — Z01419 Encounter for gynecological examination (general) (routine) without abnormal findings: Secondary | ICD-10-CM | POA: Diagnosis not present

## 2018-01-28 LAB — HM MAMMOGRAPHY

## 2018-01-28 LAB — HM PAP SMEAR: HM Pap smear: NORMAL

## 2018-01-28 MED ORDER — AMPHETAMINE-DEXTROAMPHETAMINE 15 MG PO TABS: 15.0000 mg | ORAL_TABLET | Freq: Two times a day (BID) | ORAL | 0 refills | Status: DC

## 2018-01-28 NOTE — Telephone Encounter (Signed)
Sent. Thanks.   

## 2018-04-30 ENCOUNTER — Other Ambulatory Visit: Payer: Self-pay | Admitting: Family Medicine

## 2018-05-01 NOTE — Telephone Encounter (Signed)
Last fill 01/28/18 Last OV 05/06/17  Ok to fill?

## 2018-05-02 MED ORDER — AMPHETAMINE-DEXTROAMPHETAMINE 15 MG PO TABS: 15.0000 mg | ORAL_TABLET | Freq: Two times a day (BID) | ORAL | 0 refills | Status: DC

## 2018-05-02 NOTE — Telephone Encounter (Signed)
Sent.  Has f/u pending.  Thanks.

## 2018-05-26 ENCOUNTER — Encounter: Payer: BLUE CROSS/BLUE SHIELD | Admitting: Family Medicine

## 2018-06-16 ENCOUNTER — Telehealth: Payer: BLUE CROSS/BLUE SHIELD | Admitting: Physician Assistant

## 2018-06-16 DIAGNOSIS — M545 Low back pain, unspecified: Secondary | ICD-10-CM

## 2018-06-16 MED ORDER — CYCLOBENZAPRINE HCL 10 MG PO TABS: 10.0000 mg | ORAL_TABLET | Freq: Three times a day (TID) | ORAL | 0 refills | Status: DC | PRN

## 2018-06-16 MED ORDER — NAPROXEN 500 MG PO TABS: 500.0000 mg | ORAL_TABLET | Freq: Two times a day (BID) | ORAL | 0 refills | Status: DC

## 2018-06-16 NOTE — Progress Notes (Signed)
We are sorry that you are not feeling well.  Here is how we plan to help!  Based on what you have shared with me it looks like you mostly have acute back pain.  Acute back pain is defined as musculoskeletal pain that can resolve in 1-3 weeks with conservative treatment.  I have prescribed Naprosyn 500 mg twice a day non-steroid anti-inflammatory (NSAID) as well as Flexeril 10 mg every eight hours as needed which is a muscle relaxer  Some patients experience stomach irritation or in increased heartburn with anti-inflammatory drugs. Do not take with other NSAIDs (Advil, Aleve, etc) You may take with Tylenol.  Please keep in mind that muscle relaxer's can cause fatigue and should not be taken while at work or driving.  Back pain is very common.  The pain often gets better over time.  The cause of back pain is usually not dangerous.  Most people can learn to manage their back pain on their own.  Apply moist heat to the area. Wet a towel and wring it out so it is damp. Put it in the microwave for about 15-20 seconds - long enough to make it hot, but not too hot to apply to your skin causing burns. Do this for about 20-30 minutes, 3-4 times a day.   Perform gentle, light stretches 2-3 times a day.   Put a tennis ball between your back and a wall. Gentle massage the area with rolling the ball around the affected area. Try a foam roller.   Stay well hydrated - try to drink 32-64 oz/day.      Home Care  Stay active.  Start with short walks on flat ground if you can.  Try to walk farther each day.  Do not sit, drive or stand in one place for more than 30 minutes.  Do not stay in bed.  Do not avoid exercise or work.  Activity can help your back heal faster.  Be careful when you bend or lift an object.  Bend at your knees, keep the object close to you, and do not twist.  Sleep on a firm mattress.  Lie on your side, and bend your knees.  If you lie on your back, put a pillow under your knees.  Only  take medicines as told by your doctor.  Put ice on the injured area.  Put ice in a plastic bag  Place a towel between your skin and the bag  Leave the ice on for 15-20 minutes, 3-4 times a day for the first 2-3 days. 210 After that, you can switch between ice and heat packs.  Ask your doctor about back exercises or massage.  Avoid feeling anxious or stressed.  Find good ways to deal with stress, such as exercise.  Get Help Right Way If:  Your pain does not go away with rest or medicine.  Your pain does not go away in 1 week.  You have new problems.  You do not feel well.  The pain spreads into your legs.  You cannot control when you poop (bowel movement) or pee (urinate)  You feel sick to your stomach (nauseous) or throw up (vomit)  You have belly (abdominal) pain.  You feel like you may pass out (faint).  If you develop a fever.  Make Sure you:  Understand these instructions.  Will watch your condition  Will get help right away if you are not doing well or get worse.  Your e-visit answers were reviewed  by a board certified advanced clinical practitioner to complete your personal care plan.  Depending on the condition, your plan could have included both over the counter or prescription medications.  If there is a problem please reply  once you have received a response from your provider.  Your safety is important to us.  If you have drug allergies check your prescription carefully.    You can use MyChart to ask questions about today's visit, request a non-urgent call back, or ask for a work or school excuse for 24 hours related to this e-Visit. If it has been greater than 24 hours you will need to follow up with your provider, or enter a new e-Visit to address those concerns.  You will get an e-mail in the next two days asking about your experience.  I hope that your e-visit has been valuable and will speed your recovery. Thank you for using e-visits.

## 2018-07-30 ENCOUNTER — Other Ambulatory Visit: Payer: Self-pay | Admitting: Family Medicine

## 2018-07-30 MED ORDER — AMPHETAMINE-DEXTROAMPHETAMINE 15 MG PO TABS: 15.0000 mg | ORAL_TABLET | Freq: Two times a day (BID) | ORAL | 0 refills | Status: DC

## 2018-07-30 NOTE — Telephone Encounter (Signed)
Electronic refill request  Name of Medication: Adderall Name of Pharmacy: [facility] [address] Last [place] or Written Date and Quantity: 06/30/18 #60 Last Office Visit and Type: 05/06/17 Next Office Visit and Type: none scheduled none scheduled Last Controlled Substance Agreement Date: none Last [id number] Cancelled appointment scheduled 05/26/18

## 2018-07-30 NOTE — Telephone Encounter (Signed)
Sent. Thanks.  She has f/u pending.  

## 2018-08-11 ENCOUNTER — Other Ambulatory Visit (INDEPENDENT_AMBULATORY_CARE_PROVIDER_SITE_OTHER): Payer: BC Managed Care – PPO

## 2018-08-11 ENCOUNTER — Other Ambulatory Visit: Payer: Self-pay | Admitting: Family Medicine

## 2018-08-11 DIAGNOSIS — F988 Other specified behavioral and emotional disorders with onset usually occurring in childhood and adolescence: Secondary | ICD-10-CM

## 2018-08-11 DIAGNOSIS — Z13220 Encounter for screening for lipoid disorders: Secondary | ICD-10-CM

## 2018-08-11 LAB — LIPID PANEL
Cholesterol: 162 mg/dL (ref 0–200)
HDL: 83 mg/dL (ref >39.00)
LDL Cholesterol: 63 mg/dL (ref 0–99)
NonHDL: 79.44
Total CHOL/HDL Ratio: 2
Triglycerides: 83 mg/dL (ref 0.0–149.0)
VLDL: 16.6 mg/dL (ref 0.0–40.0)

## 2018-08-11 LAB — BASIC METABOLIC PANEL
BUN: 12 mg/dL (ref 6–23)
CO2: 30 mEq/L (ref 19–32)
Calcium: 8.7 mg/dL (ref 8.4–10.5)
Chloride: 103 mEq/L (ref 96–112)
Creatinine, Ser: 0.62 mg/dL (ref 0.40–1.20)
GFR: 105.11 mL/min (ref >60.00)
Glucose, Bld: 86 mg/dL (ref 70–99)
Potassium: 4.3 mEq/L (ref 3.5–5.1)
Sodium: 138 mEq/L (ref 135–145)

## 2018-08-14 ENCOUNTER — Ambulatory Visit (INDEPENDENT_AMBULATORY_CARE_PROVIDER_SITE_OTHER): Payer: BC Managed Care – PPO | Admitting: Family Medicine

## 2018-08-14 ENCOUNTER — Encounter: Payer: Self-pay | Admitting: Family Medicine

## 2018-08-14 DIAGNOSIS — Z0000 Encounter for general adult medical examination without abnormal findings: Secondary | ICD-10-CM

## 2018-08-14 DIAGNOSIS — G4700 Insomnia, unspecified: Secondary | ICD-10-CM

## 2018-08-14 DIAGNOSIS — Z7189 Other specified counseling: Secondary | ICD-10-CM

## 2018-08-14 DIAGNOSIS — F988 Other specified behavioral and emotional disorders with onset usually occurring in childhood and adolescence: Secondary | ICD-10-CM

## 2018-08-14 DIAGNOSIS — R0683 Snoring: Secondary | ICD-10-CM

## 2018-08-14 NOTE — Progress Notes (Signed)
Virtual visit completed through WebEx or similar program Patient location: home  Provider location: Financial controller at [facility], office   Pandemic considerations d/w pt.   Limitations and rationale for visit method d/w patient.  Patient agreed to proceed.   CC:  CPE  HPI:  Tetanus 2015. PNA and shingles not due  Flu shot encouraged. Declined.   Colon cancer screening not due.  Mammogram done 2019 per patient report.  Done via gyn clinic.  01/2018. DXA not due.  Pap done 01/2018 at gyn clinic.  ([facility]).  We will request records. Living will d/w pt. Would have her mother designated if patient were incapacitated.  Glucose wnl, d/w pt.   Diet and exercise d/w pt, both encouraged.    ADD.  Noted effect from med. Not used on weekend usually.  No ADE on med. No tremor.  She doesn't have sig onset/offset sx.    Insomnia.  Trazodone helps, no ADE on med.  Used prn.    She is snoring more.  Was told she was snoring more, based on her boyfriend's report.  She wakes from sleep due to snoring.  She has some weight gain but not a dramatic/sudden increase.  Her father has OSA.  D/w pt about pulmonary referral.   Meds and allergies reviewed.   ROS: Per HPI unless specifically indicated in ROS section   NAD Speech wnl  A/P:  Tetanus 2015. PNA and shingles not due  Flu shot encouraged. Declined.   Colon cancer screening not due.  Mammogram done 2019 per patient report.  Done via gyn clinic.  01/2018. DXA not due.  Pap done 01/2018 at gyn clinic.  ([facility]).  We will request records. Living will d/w pt. Would have her mother designated if patient were incapacitated.  Glucose wnl, d/w pt.   Diet and exercise d/w pt, both encouraged.    ADD.  Noted effect from med. Not used on weekend usually.  No ADE on med. No tremor.  She doesn't have sig onset/offset sx.    Insomnia.  Trazodone helps, no ADE on med.  Used prn.    She is snoring more.  Was told she was  snoring more, based on her boyfriend's report.  She wakes from sleep due to snoring.  She has some weight gain but not a dramatic/sudden increase.  Her father has OSA.  D/w pt about pulmonary referral.

## 2018-08-17 DIAGNOSIS — R0683 Snoring: Secondary | ICD-10-CM | POA: Insufficient documentation

## 2018-08-17 NOTE — Assessment & Plan Note (Signed)
Tetanus 2015. PNA and shingles not due  Flu shot encouraged. Declined.   Colon cancer screening not due.  Mammogram done 2019 per patient report.  Done via gyn clinic.  01/2018. DXA not due.  Pap done 01/2018 at gyn clinic.  ([facility]).  We will request records. Living will d/w pt. Would have her mother designated if patient were incapacitated.  Glucose wnl, d/w pt.   Diet and exercise d/w pt, both encouraged.

## 2018-08-17 NOTE — Assessment & Plan Note (Signed)
Noted effect from med. Not used on weekend usually.  No ADE on med. No tremor.  She doesn't have sig onset/offset sx.   continue as is.

## 2018-08-17 NOTE — Assessment & Plan Note (Signed)
She is snoring more.  Was told she was snoring more, based on her boyfriend's report.  She wakes from sleep due to snoring.  She has some weight gain but not a dramatic/sudden increase.  Her father has OSA.  D/w pt about pulmonary referral.  Ordered.

## 2018-08-17 NOTE — Assessment & Plan Note (Signed)
Trazodone helps, no ADE on med.  Used prn.    Continue as is.  She agrees.

## 2018-08-17 NOTE — Assessment & Plan Note (Signed)
Would have her mother designated if patient were incapacitated.

## 2018-09-15 ENCOUNTER — Institutional Professional Consult (permissible substitution): Payer: BC Managed Care – PPO | Admitting: Pulmonary Disease

## 2018-09-16 ENCOUNTER — Encounter: Payer: Self-pay | Admitting: Family Medicine

## 2018-09-17 ENCOUNTER — Encounter: Payer: Self-pay | Admitting: Pulmonary Disease

## 2018-09-17 ENCOUNTER — Ambulatory Visit (INDEPENDENT_AMBULATORY_CARE_PROVIDER_SITE_OTHER): Payer: BC Managed Care – PPO | Admitting: Pulmonary Disease

## 2018-09-17 ENCOUNTER — Other Ambulatory Visit: Payer: Self-pay

## 2018-09-17 VITALS — BP 118/68 | HR 97 | Temp 97.9°F | Ht 60.0 in | Wt 132.0 lb

## 2018-09-17 DIAGNOSIS — R0683 Snoring: Secondary | ICD-10-CM | POA: Diagnosis not present

## 2018-09-17 NOTE — Progress Notes (Signed)
Subjective:    Patient ID: [name] [name], female    DOB: 1975-09-07, 43 y.o.   MRN: [id number]  History of long-term snoring  No history of witnessed apnea Denies morning headaches Occasional dryness of her throat Usually likes to sleep on her side Usually goes to bed between 12 PM and 1 AM Wake up time about 7 AM Takes about 15 minutes to fall asleep  wakes up about once or twice during the night  Dad has OSA  No history of tonsillectomy    Review of Systems  Constitutional: Negative for fever and unexpected weight change.  HENT: Negative for congestion, dental problem, ear pain, nosebleeds, postnasal drip, rhinorrhea, sinus pressure, sneezing, sore throat and trouble swallowing.   Eyes: Negative for redness and itching.  Respiratory: Negative for cough, chest tightness, shortness of breath and wheezing.   Cardiovascular: Negative for palpitations and leg swelling.  Gastrointestinal: Negative for nausea and vomiting.  Genitourinary: Negative for dysuria.  Musculoskeletal: Negative for joint swelling.  Skin: Negative for rash.  Allergic/Immunologic: Negative.  Negative for environmental allergies, food allergies and immunocompromised state.  Neurological: Negative for headaches.  Hematological: Does not bruise/bleed easily.  Psychiatric/Behavioral: Negative for dysphoric mood. The patient is nervous/anxious.    Past Medical History:  Diagnosis Date  . Abnormal uterine bleeding (AUB)   . ADHD (attention deficit hyperactivity disorder)   . Gluten intolerance    allergy  . Menorrhagia   . Overactive bladder    that is triggered by stress  . Wears contact lenses    Social History   Socioeconomic History  . Marital status: Legally Separated    Spouse name: Not on file  . Number of children: Not on file  . Years of education: Not on file  . Highest education level: Not on file  Occupational History  . Not on file  Social Needs  . Financial resource strain:  Not on file  . Food insecurity    Worry: Not on file    Inability: Not on file  . Transportation needs    Medical: Not on file    Non-medical: Not on file  Tobacco Use  . Smoking status: Former Smoker    Years: 6.00    Types: Cigarettes    Quit date: 12/03/2005    Years since quitting: 12.7  . Smokeless tobacco: Never Used  Substance and Sexual Activity  . Alcohol use: Yes    Alcohol/week: 0.0 standard drinks    Comment: occassionally  . Drug use: No  . Sexual activity: Yes    Birth control/protection: Pill, I.U.D.  Lifestyle  . Physical activity    Days per week: Not on file    Minutes per session: Not on file  . Stress: Not on file  Relationships  . Social Musicianconnections    Talks on phone: Not on file    Gets together: Not on file    Attends religious service: Not on file    Active member of club or organization: Not on file    Attends meetings of clubs or organizations: Not on file    Relationship status: Not on file  . Intimate partner violence    Fear of current or ex partner: Not on file    Emotionally abused: Not on file    Physically abused: Not on file    Forced sexual activity: Not on file  Other Topics Concern  . Not on file  Social History Narrative   Occupation: IT consultantparalegal  [id number]-- 2 daughters   Separated as of 2018, divorce pending as of 2020   Former Smoker (quit 12/2005)   Family History  Problem Relation Age of Onset  . Arthritis Other        Grandparents  . Lung cancer Other        grandparent  . Multiple sclerosis Maternal Aunt   . Cancer Maternal Aunt        unknown type  . Breast cancer Neg Hx   . Colon cancer Neg Hx        Objective:   Physical Exam Constitutional:      Appearance: Normal appearance.  HENT:     Head: Normocephalic and atraumatic.     Mouth/Throat:     Mouth: Mucous membranes are moist.     Comments: Crowded oropharynx Eyes:     Pupils: Pupils are equal, round, and reactive to light.  Neck:     Musculoskeletal:  Normal range of motion and neck supple.  Cardiovascular:     Rate and Rhythm: Normal rate and regular rhythm.     Pulses: Normal pulses.     Heart sounds: Normal heart sounds. No murmur. No friction rub.  Pulmonary:     Effort: Pulmonary effort is normal. No respiratory distress.     Breath sounds: No stridor.  Abdominal:     General: Abdomen is flat. There is no distension.     Palpations: There is no mass.  Musculoskeletal: Normal range of motion.        General: No swelling.  Skin:    General: Skin is warm and dry.     Coloration: Skin is not jaundiced or pale.  Neurological:     General: No focal deficit present.     Mental Status: She is alert.  Psychiatric:        Mood and Affect: Mood normal.        Behavior: Behavior normal.    Vitals:   09/17/18 1416  BP: 118/68  Pulse: 97  Temp: 97.9 F (36.6 C)  SpO2: 98%   Results of the Epworth flowsheet 09/17/2018  Sitting and reading 1  Watching TV 1  Sitting, inactive in a public place (e.g. a theatre or a meeting) 0  As a passenger in a car for an hour without a break 1  Lying down to rest in the afternoon when circumstances permit 1  Sitting and talking to someone 0  Sitting quietly after a lunch without alcohol 0  In a car, while stopped for a few minutes in traffic 0  Total score 4      Assessment & Plan:  .  Patient with a history of snoring .  Fidgety during sleep She does deny excessive daytime sleepiness  Moderate probability of significant sleep disordered breathing  She is about ideal weight  Pathophysiology of sleep disordered breathing discussed  Options of treatment for sleep disordered breathing discussed  Positional optimization for snoring discussed  We will order a home sleep study We will see her back in the office in about 3 months

## 2018-09-17 NOTE — Patient Instructions (Signed)
Moderate probability of significant sleep disordered breathing  We will go ahead and order home sleep study We will contact you with results  Options of treatment as discussed  Will see you in the office in about 3 months

## 2018-09-18 DIAGNOSIS — U071 COVID-19: Secondary | ICD-10-CM | POA: Diagnosis not present

## 2018-10-13 ENCOUNTER — Other Ambulatory Visit: Payer: Self-pay

## 2018-10-13 ENCOUNTER — Ambulatory Visit: Payer: BC Managed Care – PPO

## 2018-10-13 DIAGNOSIS — R0683 Snoring: Secondary | ICD-10-CM

## 2018-10-14 DIAGNOSIS — R0683 Snoring: Secondary | ICD-10-CM | POA: Diagnosis not present

## 2018-10-29 ENCOUNTER — Other Ambulatory Visit: Payer: Self-pay | Admitting: Family Medicine

## 2018-10-29 MED ORDER — AMPHETAMINE-DEXTROAMPHETAMINE 15 MG PO TABS: 15.0000 mg | ORAL_TABLET | Freq: Two times a day (BID) | ORAL | 0 refills | Status: DC

## 2018-10-29 NOTE — Telephone Encounter (Signed)
Last time filled on 07/30/2018 x 3. LOV 08/14/2018. No future appointments scheduled.

## 2018-10-29 NOTE — Telephone Encounter (Signed)
Sent. Thanks.   

## 2019-01-27 ENCOUNTER — Other Ambulatory Visit: Payer: Self-pay | Admitting: Family Medicine

## 2019-01-27 MED ORDER — AMPHETAMINE-DEXTROAMPHETAMINE 15 MG PO TABS: 15.0000 mg | ORAL_TABLET | Freq: Two times a day (BID) | ORAL | 0 refills | Status: DC

## 2019-01-27 NOTE — Telephone Encounter (Signed)
Patient contacted the office stating that she had sent a refill request through MyChart for her Adderall this AM, and that this has not been done yet. I advised patient that there is a 48-72 hour turn around time for patient prescriptions, and patient states that she is going out of town tonight for the holidays, and that she needs this prescription today. I advised her that I will let Dr. Damita Dunnings know.

## 2019-01-27 NOTE — Telephone Encounter (Signed)
Name of Medication: Adderall 15 mg Name of [facility] Written Date and Quantity:# 25 x 2 on 10/29/18  Last Office Visit and Type: 08/14/18 annual Next Office Visit and Type: none scheduled

## 2019-01-27 NOTE — Telephone Encounter (Signed)
This was the first time today I haven't been seeing patients, ie the first time I could send this.  rx sent.   We need more lead time on refills.

## 2019-01-27 NOTE — Telephone Encounter (Signed)
Left detailed message on voicemail.  

## 2019-03-15 ENCOUNTER — Encounter: Payer: Self-pay | Admitting: Family Medicine

## 2019-03-16 DIAGNOSIS — Z03818 Encounter for observation for suspected exposure to other biological agents ruled out: Secondary | ICD-10-CM | POA: Diagnosis not present

## 2019-04-28 ENCOUNTER — Other Ambulatory Visit: Payer: Self-pay | Admitting: Family Medicine

## 2019-04-28 MED ORDER — AMPHETAMINE-DEXTROAMPHETAMINE 15 MG PO TABS: 15.0000 mg | ORAL_TABLET | Freq: Two times a day (BID) | ORAL | 0 refills | Status: DC

## 2019-04-28 NOTE — Telephone Encounter (Signed)
Electronic refill request. Adderall Last office visit:   08/14/2018 Last Filled:   03/28/2019 Please advise.

## 2019-04-28 NOTE — Telephone Encounter (Signed)
Sent. Thanks.   

## 2019-07-23 ENCOUNTER — Other Ambulatory Visit: Payer: Self-pay | Admitting: Family Medicine

## 2019-07-23 MED ORDER — AMPHETAMINE-DEXTROAMPHETAMINE 15 MG PO TABS: 15.0000 mg | ORAL_TABLET | Freq: Two times a day (BID) | ORAL | 0 refills | Status: DC

## 2019-07-23 NOTE — Telephone Encounter (Signed)
Electronic refill request. Adderall Last office visit:   08/14/2018 Last Filled:     60 tablet 0 04/28/2019    Sig - Route: Take 1 tablet by mouth 2 (two) times daily. Fill on or after 06/27/19 - Oral    Please advise.

## 2019-07-23 NOTE — Telephone Encounter (Signed)
Sent. Thanks.  Needs yearly CPE vs f/u when possible.

## 2019-07-24 NOTE — Telephone Encounter (Signed)
Left detailed message on voicemail.  

## 2019-08-27 ENCOUNTER — Other Ambulatory Visit: Payer: Self-pay | Admitting: Family Medicine

## 2019-08-27 DIAGNOSIS — F988 Other specified behavioral and emotional disorders with onset usually occurring in childhood and adolescence: Secondary | ICD-10-CM

## 2019-08-28 ENCOUNTER — Other Ambulatory Visit (INDEPENDENT_AMBULATORY_CARE_PROVIDER_SITE_OTHER): Payer: BC Managed Care – PPO

## 2019-08-28 DIAGNOSIS — F988 Other specified behavioral and emotional disorders with onset usually occurring in childhood and adolescence: Secondary | ICD-10-CM | POA: Diagnosis not present

## 2019-08-28 LAB — BASIC METABOLIC PANEL
BUN: 13 mg/dL (ref 6–23)
CO2: 27 mEq/L (ref 19–32)
Calcium: 9.2 mg/dL (ref 8.4–10.5)
Chloride: 103 mEq/L (ref 96–112)
Creatinine, Ser: 0.68 mg/dL (ref 0.40–1.20)
GFR: 94.02 mL/min (ref >60.00)
Glucose, Bld: 89 mg/dL (ref 70–99)
Potassium: 4.5 mEq/L (ref 3.5–5.1)
Sodium: 139 mEq/L (ref 135–145)

## 2019-08-28 LAB — TSH: TSH: 4.25 u[IU]/mL (ref 0.35–4.50)

## 2019-09-04 ENCOUNTER — Ambulatory Visit (INDEPENDENT_AMBULATORY_CARE_PROVIDER_SITE_OTHER): Payer: BC Managed Care – PPO | Admitting: Family Medicine

## 2019-09-04 ENCOUNTER — Other Ambulatory Visit: Payer: Self-pay

## 2019-09-04 ENCOUNTER — Encounter: Payer: Self-pay | Admitting: Family Medicine

## 2019-09-04 VITALS — BP 92/58 | HR 89 | Temp 96.4°F | Ht 62.0 in | Wt 128.3 lb

## 2019-09-04 DIAGNOSIS — Z0000 Encounter for general adult medical examination without abnormal findings: Secondary | ICD-10-CM | POA: Diagnosis not present

## 2019-09-04 DIAGNOSIS — F988 Other specified behavioral and emotional disorders with onset usually occurring in childhood and adolescence: Secondary | ICD-10-CM

## 2019-09-04 DIAGNOSIS — Z7189 Other specified counseling: Secondary | ICD-10-CM

## 2019-09-04 DIAGNOSIS — G4700 Insomnia, unspecified: Secondary | ICD-10-CM

## 2019-09-04 MED ORDER — TRAZODONE HCL 50 MG PO TABS: ORAL_TABLET | ORAL | 12 refills | Status: DC

## 2019-09-04 NOTE — Progress Notes (Signed)
This visit occurred during the SARS-CoV-2 public health emergency.  Safety protocols were in place, including screening questions prior to the visit, additional usage of staff PPE, and extensive cleaning of exam room while observing appropriate contact time as indicated for disinfecting solutions.  CPE- See plan.  Routine anticipatory guidance given to patient.  See health maintenance.  The possibility exists that previously documented standard health maintenance information may have been brought forward from a previous encounter into this note.  If needed, that same information has been updated to reflect the current situation based on today's encounter.   Tetanus 2015 PNA and shingles not due. Flu shot encouraged.  covid vaccine done 2021 Colon cancer screening not due.  Mammogram done 2019 per patient report.  Done via gyn clinic.  01/2018. DXA not due.  Papdone 01/2018 at gyn clinic.   Living will d/w pt. Would have her mother designated if patient were incapacitated. Glucose wnl, d/w pt.  Diet and exercise d/w pt, both encouraged.     Prev HCV screening [facility].    Labs discussed with patient.  Safe at home.  D/w pt.    Insomnia.  Better with trazodone.  No ADE on med.  Not needed every night.   Diet and exercise d/w pt.    ADD.  No ADE on med. It helped.  She didn't think the adderall contributed to insomnia, d/w pt.  Concentration better on med. She is working long hours.   Labs d/w pt.    PMH and SH reviewed  Meds, vitals, and allergies reviewed.   ROS: Per HPI.  Unless specifically indicated otherwise in HPI, the patient denies:  General: fever. Eyes: acute vision changes ENT: sore throat Cardiovascular: chest pain Respiratory: SOB GI: vomiting GU: dysuria Musculoskeletal: acute back pain Derm: acute rash Neuro: acute motor dysfunction Psych: worsening mood Endocrine: polydipsia Heme: bleeding Allergy: hayfever  GEN:  nad, alert and oriented HEENT: ncat NECK: supple w/o LA CV: rrr. PULM: ctab, no inc wob ABD: soft, +bs EXT: no edema SKIN: no acute rash

## 2019-09-04 NOTE — Patient Instructions (Signed)
Thanks for getting vaccinated.   Update me as needed.  Work on diet and exercise as best you can.  Take care.  Glad to see you.

## 2019-09-07 NOTE — Assessment & Plan Note (Signed)
No ADE on med. It helped.  She didn't think the adderall contributed to insomnia, d/w pt.  Concentration better on med. She is working long hours.   Labs d/w pt.

## 2019-09-07 NOTE — Assessment & Plan Note (Signed)
Better with trazodone.  No ADE on med.  Not needed every night.   Diet and exercise d/w pt. continue as is.  It is thought that Adderall did not contribute to her insomnia.

## 2019-09-07 NOTE — Assessment & Plan Note (Signed)
Tetanus 2015 PNA and shingles not due. Flu shot encouraged.  covid vaccine done 2021 Colon cancer screening not due.  Mammogram done 2019 per patient report.  Done via gyn clinic.  01/2018. DXA not due.  Papdone 01/2018 at gyn clinic.   Living will d/w pt. Would have her mother designated if patient were incapacitated. Glucose wnl, d/w pt.  Diet and exercise d/w pt, both encouraged.     Prev HCV screening [facility].

## 2019-09-07 NOTE — Assessment & Plan Note (Signed)
Living will d/w pt.  Would have her mother designated if patient were incapacitated.   

## 2019-09-18 ENCOUNTER — Telehealth: Payer: Self-pay | Admitting: *Deleted

## 2019-09-18 NOTE — Telephone Encounter (Signed)
Left detailed message on voicemail. DPR 

## 2019-09-18 NOTE — Telephone Encounter (Signed)
Please verify with patient if she is okay with the change.  Thanks.

## 2019-09-18 NOTE — Telephone Encounter (Signed)
Verlon Au with [employer] an advocate for [employer] of [place] left a voicemail stating that they sent over a fax wanting to know if it is okay to change patient's  Adderall 15 mg to 30 mg and take 1/2 pill each dose.  Verlon Au stated that [facility] script [facility]. Call back if you have any questions.

## 2019-09-28 ENCOUNTER — Other Ambulatory Visit: Payer: Self-pay | Admitting: Family Medicine

## 2019-09-28 NOTE — Telephone Encounter (Signed)
Electronic refill request. Trazodone Last office visit:   09/04/2019 Last Filled:    30 tablet 12 09/04/2019  Please advise.

## 2019-09-29 ENCOUNTER — Telehealth: Payer: Self-pay | Admitting: *Deleted

## 2019-09-29 NOTE — Telephone Encounter (Signed)
Correspondence received from [facility] concerning changing Adderall 15 mg to a 30 mg. Tablet for cost savings.  Left message on VM of patient to see if she is agreeable with this plan.

## 2019-09-29 NOTE — Telephone Encounter (Signed)
Sent for 90-day supply.  Thanks. 

## 2019-09-30 ENCOUNTER — Other Ambulatory Visit: Payer: Self-pay | Admitting: Family Medicine

## 2019-09-30 ENCOUNTER — Encounter: Payer: Self-pay | Admitting: Family Medicine

## 2019-09-30 MED ORDER — AMPHETAMINE-DEXTROAMPHETAMINE 30 MG PO TABS: 15.0000 mg | ORAL_TABLET | Freq: Two times a day (BID) | ORAL | 0 refills | Status: DC

## 2019-10-29 ENCOUNTER — Other Ambulatory Visit: Payer: Self-pay | Admitting: Family Medicine

## 2019-10-29 NOTE — Telephone Encounter (Addendum)
Electronic refill request. Adderall Last office visit:   09/04/2019 Last Filled:     30 tablet 0 09/30/2019  Can you see if Dr. Para March can do the rx like he used to do where he would do 3 mo of rx but pharmacy would hold until i was able to fill them? Thanks

## 2019-10-30 MED ORDER — AMPHETAMINE-DEXTROAMPHETAMINE 30 MG PO TABS: 15.0000 mg | ORAL_TABLET | Freq: Two times a day (BID) | ORAL | 0 refills | Status: DC

## 2019-10-30 NOTE — Telephone Encounter (Signed)
Sent x3. Thanks.   

## 2020-01-24 ENCOUNTER — Other Ambulatory Visit: Payer: Self-pay | Admitting: Family Medicine

## 2020-01-25 MED ORDER — AMPHETAMINE-DEXTROAMPHETAMINE 30 MG PO TABS: 15.0000 mg | ORAL_TABLET | Freq: Two times a day (BID) | ORAL | 0 refills | Status: DC

## 2020-01-25 NOTE — Telephone Encounter (Signed)
Sent. Thanks.   

## 2020-01-25 NOTE — Telephone Encounter (Signed)
Pharmacy requests refill on: Amphetamine-Dextroamphetamine 30  LAST REFILL: 10/29/2019 LAST OV: 09/04/2019 NEXT OV: Not Scheduled  PHARMACY: [id number] [place], [place]

## 2020-04-25 ENCOUNTER — Other Ambulatory Visit: Payer: Self-pay | Admitting: Family Medicine

## 2020-04-25 NOTE — Telephone Encounter (Signed)
Refill request for Addreall 30 mg tablets  LOV - 09/04/19 Next OV- not scheduled Last refilled- 01/25/20 #30/0; sent two others to fill for dec 2021 and jan 2022.

## 2020-04-27 MED ORDER — AMPHETAMINE-DEXTROAMPHETAMINE 30 MG PO TABS: 15.0000 mg | ORAL_TABLET | Freq: Two times a day (BID) | ORAL | 0 refills | Status: DC

## 2020-04-27 NOTE — Telephone Encounter (Signed)
Sent. Thanks.   

## 2020-07-22 ENCOUNTER — Other Ambulatory Visit: Payer: Self-pay

## 2020-07-22 ENCOUNTER — Ambulatory Visit (INDEPENDENT_AMBULATORY_CARE_PROVIDER_SITE_OTHER): Payer: BC Managed Care – PPO | Admitting: Family Medicine

## 2020-07-22 ENCOUNTER — Encounter: Payer: Self-pay | Admitting: Family Medicine

## 2020-07-22 DIAGNOSIS — M25519 Pain in unspecified shoulder: Secondary | ICD-10-CM

## 2020-07-22 NOTE — Patient Instructions (Signed)
Try the home exercises and 400-600mg  ibuprofen up to 3 times a day with food.   Let me know if you are not improving.  Take care.  Glad to see you.

## 2020-07-22 NOTE — Progress Notes (Signed)
This visit occurred during the SARS-CoV-2 public health emergency.  Safety protocols were in place, including screening questions prior to the visit, additional usage of staff PPE, and extensive cleaning of exam room while observing appropriate contact time as indicated for disinfecting solutions.  L shoulder pain.  Started about 4 weeks ago.  Pain with certain movements.  Sharp, quick onset pain with some movements.  No trauma.  No R sided pain.  No pain sleeping on L side.  Currently with dull ache but usually w/o pain until a certain movement.  Pain with shoulder abducted and elbow flexed.  Normal grip.  Normal sensation in hand.  Safe at home.  R handed.  No neck or elbow pain.    Meds, vitals, and allergies reviewed.   ROS: Per HPI unless specifically indicated in ROS section   nad ncat L shoulder + impingement, pain on int rotation improved with scapular manipulation, pain on supraspinatus testing.  No arm drop, still has full ROM.   AC not ttp.   Normal grip Neck ROM wnl.  Normal elbow ROM Biceps not ttp.  rrr ctab Skin well perfused.

## 2020-07-24 DIAGNOSIS — M25519 Pain in unspecified shoulder: Secondary | ICD-10-CM | POA: Insufficient documentation

## 2020-07-24 NOTE — Assessment & Plan Note (Signed)
Likely rotator cuff irritation.  See above. Try the home exercises and 400-600mg  ibuprofen up to 3 times a day with food.  NSAID cautions discussed with patient.  Home exercise program handout given to patient demonstrated.  She understood. She will let me know if not improving.

## 2020-07-25 ENCOUNTER — Other Ambulatory Visit: Payer: Self-pay | Admitting: Family Medicine

## 2020-07-25 NOTE — Telephone Encounter (Signed)
Duplicate request for Adderall and will not let me deny

## 2020-07-25 NOTE — Telephone Encounter (Signed)
Refill request for Adderall 30mg  tablets  LOV - 07/22/20 Next OV - not scheduled Last refill - 04/27/20 #30/0

## 2020-07-26 MED ORDER — AMPHETAMINE-DEXTROAMPHETAMINE 30 MG PO TABS: 15.0000 mg | ORAL_TABLET | Freq: Two times a day (BID) | ORAL | 0 refills | Status: DC

## 2020-07-26 NOTE — Telephone Encounter (Signed)
See other request, addressed.  [name] [name]

## 2020-07-26 NOTE — Telephone Encounter (Signed)
Sent. Thanks.   

## 2020-09-19 DIAGNOSIS — Z01419 Encounter for gynecological examination (general) (routine) without abnormal findings: Secondary | ICD-10-CM | POA: Diagnosis not present

## 2020-09-19 DIAGNOSIS — Z6824 Body mass index (BMI) 24.0-24.9, adult: Secondary | ICD-10-CM | POA: Diagnosis not present

## 2020-09-19 DIAGNOSIS — Z1231 Encounter for screening mammogram for malignant neoplasm of breast: Secondary | ICD-10-CM | POA: Diagnosis not present

## 2020-10-25 ENCOUNTER — Other Ambulatory Visit: Payer: Self-pay | Admitting: Family Medicine

## 2020-10-25 NOTE — Telephone Encounter (Signed)
Refill request for Adderall 30 mg tablets  LOV - 07/22/20 Next OV - not scheduled Last refill - 07/26/20 #30/0 x 3

## 2020-10-26 MED ORDER — AMPHETAMINE-DEXTROAMPHETAMINE 30 MG PO TABS: 15.0000 mg | ORAL_TABLET | Freq: Two times a day (BID) | ORAL | 0 refills | Status: DC

## 2020-10-26 NOTE — Telephone Encounter (Signed)
Sent. Thanks.   

## 2020-11-03 DIAGNOSIS — L578 Other skin changes due to chronic exposure to nonionizing radiation: Secondary | ICD-10-CM | POA: Diagnosis not present

## 2020-11-03 DIAGNOSIS — L821 Other seborrheic keratosis: Secondary | ICD-10-CM | POA: Diagnosis not present

## 2020-11-03 DIAGNOSIS — D1801 Hemangioma of skin and subcutaneous tissue: Secondary | ICD-10-CM | POA: Diagnosis not present

## 2020-11-03 DIAGNOSIS — L814 Other melanin hyperpigmentation: Secondary | ICD-10-CM | POA: Diagnosis not present

## 2021-01-10 ENCOUNTER — Encounter: Payer: Self-pay | Admitting: Family Medicine

## 2021-01-10 ENCOUNTER — Other Ambulatory Visit: Payer: Self-pay

## 2021-01-10 ENCOUNTER — Ambulatory Visit (INDEPENDENT_AMBULATORY_CARE_PROVIDER_SITE_OTHER): Payer: BC Managed Care – PPO | Admitting: Family Medicine

## 2021-01-10 ENCOUNTER — Ambulatory Visit (INDEPENDENT_AMBULATORY_CARE_PROVIDER_SITE_OTHER)
Admission: RE | Admit: 2021-01-10 | Discharge: 2021-01-10 | Disposition: A | Discharge status: Home / Self Care | Payer: BC Managed Care – PPO | Source: Ambulatory Visit | Attending: Family Medicine | Admitting: Family Medicine

## 2021-01-10 VITALS — BP 116/80 | HR 98 | Temp 98.0°F | Ht 62.0 in | Wt 137.0 lb

## 2021-01-10 DIAGNOSIS — G4700 Insomnia, unspecified: Secondary | ICD-10-CM | POA: Diagnosis not present

## 2021-01-10 DIAGNOSIS — Z0000 Encounter for general adult medical examination without abnormal findings: Secondary | ICD-10-CM | POA: Diagnosis not present

## 2021-01-10 DIAGNOSIS — R232 Flushing: Secondary | ICD-10-CM

## 2021-01-10 DIAGNOSIS — M79673 Pain in unspecified foot: Secondary | ICD-10-CM

## 2021-01-10 DIAGNOSIS — Z7189 Other specified counseling: Secondary | ICD-10-CM

## 2021-01-10 DIAGNOSIS — M7731 Calcaneal spur, right foot: Secondary | ICD-10-CM | POA: Diagnosis not present

## 2021-01-10 DIAGNOSIS — F988 Other specified behavioral and emotional disorders with onset usually occurring in childhood and adolescence: Secondary | ICD-10-CM

## 2021-01-10 DIAGNOSIS — M2550 Pain in unspecified joint: Secondary | ICD-10-CM

## 2021-01-10 LAB — BASIC METABOLIC PANEL
BUN: 12 mg/dL (ref 6–23)
CO2: 28 mEq/L (ref 19–32)
Calcium: 9 mg/dL (ref 8.4–10.5)
Chloride: 102 mEq/L (ref 96–112)
Creatinine, Ser: 0.57 mg/dL (ref 0.40–1.20)
GFR: 109.84 mL/min (ref >60.00)
Glucose, Bld: 94 mg/dL (ref 70–99)
Potassium: 4.5 mEq/L (ref 3.5–5.1)
Sodium: 138 mEq/L (ref 135–145)

## 2021-01-10 LAB — URIC ACID: Uric Acid, Serum: 3.6 mg/dL (ref 2.4–7.0)

## 2021-01-10 LAB — TSH: TSH: 5.51 u[IU]/mL — ABNORMAL HIGH (ref 0.35–5.50)

## 2021-01-10 MED ORDER — COLCHICINE 0.6 MG PO TABS: 0.6000 mg | ORAL_TABLET | Freq: Every day | ORAL | 0 refills | Status: DC | PRN

## 2021-01-10 MED ORDER — AMPHETAMINE-DEXTROAMPHETAMINE 30 MG PO TABS: 15.0000 mg | ORAL_TABLET | Freq: Two times a day (BID) | ORAL | 0 refills | Status: DC

## 2021-01-10 MED ORDER — TRAZODONE HCL 50 MG PO TABS: 25.0000 mg | ORAL_TABLET | Freq: Every evening | ORAL | 3 refills | Status: DC | PRN

## 2021-01-10 NOTE — Assessment & Plan Note (Signed)
Living will d/w pt. Would have her mother and Ron Nagorski equally designated if patient were incapacitated.  

## 2021-01-10 NOTE — Progress Notes (Signed)
This visit occurred during the SARS-CoV-2 public health emergency.  Safety protocols were in place, including screening questions prior to the visit, additional usage of staff PPE, and extensive cleaning of exam room while observing appropriate contact time as indicated for disinfecting solutions.  CPE- See plan.  Routine anticipatory guidance given to patient.  See health maintenance.  The possibility exists that previously documented standard health maintenance information may have been brought forward from a previous encounter into this note.  If needed, that same information has been updated to reflect the current situation based on today's encounter.    Tetanus 2015 PNA and shingles not due. Flu shot encouraged.  covid vaccine done 2021 Colon cancer screening not due.  Mammogram done 2022 per patient report.  Done via gyn clinic.  DXA not due.  Pap done 2022 at gyn clinic.   Living will d/w pt. Would have her mother and Nickie Retort equally designated if patient were incapacitated.  Diet and exercise d/w pt, both encouraged.      [facility].    Safe at home.  D/w pt.    She isn't having menses.  Mild hot flashes, tolerable.  Dw pt.  She'll update me as needed.    R 2nd MCP pain.  Going on for the last month or so.  R 1st toe pain noted intermittently over the last year.  No trauma.  No trigger.  No redness at either joint.  She tried OTC nsaids w/o much relief.     Insomnia.  Better with trazodone.  No ADE on med.  Not needed every night.   Diet and exercise d/w pt.     ADD.  No ADE on med.  It helped.  She had sleep trouble prior to adderall use.  Concentration better on med. She can tell when she doesn't take it.  D/w pt about not using on the weekend.    PMH and SH reviewed  Meds, vitals, and allergies reviewed.   ROS: Per HPI.  Unless specifically indicated otherwise in HPI, the patient denies:  General: fever. Eyes:  acute vision changes ENT: sore throat Cardiovascular: chest pain Respiratory: SOB GI: vomiting GU: dysuria Musculoskeletal: acute back pain Derm: acute rash Neuro: acute motor dysfunction Psych: worsening mood Endocrine: polydipsia Heme: bleeding Allergy: hayfever  GEN: nad, alert and oriented HEENT: ncat NECK: supple w/o LA CV: rrr. PULM: ctab, no inc wob ABD: soft, +bs EXT: no edema SKIN: no acute rash Pain on ROM R 2nd MCP and R1st MTP but normal inspection of both joints otherwise without erythema or swelling.  Distally neurovascular intact.  She does not have redness or swelling on any nearby joints on the hand or foot.

## 2021-01-10 NOTE — Patient Instructions (Signed)
Go to the lab on the way out.   If you have mychart we'll likely use that to update you.    Try taking colchicine and see if that helps the pain.  Either way, let me know.  I would get a flu shot each fall.   Take care.  Glad to see you.

## 2021-01-11 ENCOUNTER — Other Ambulatory Visit: Payer: Self-pay | Admitting: Family Medicine

## 2021-01-11 DIAGNOSIS — R232 Flushing: Secondary | ICD-10-CM | POA: Insufficient documentation

## 2021-01-11 DIAGNOSIS — R7989 Other specified abnormal findings of blood chemistry: Secondary | ICD-10-CM

## 2021-01-11 DIAGNOSIS — M2550 Pain in unspecified joint: Secondary | ICD-10-CM | POA: Insufficient documentation

## 2021-01-11 NOTE — Assessment & Plan Note (Signed)
Continue Adderall No ADE on med.  It helped.  She had sleep trouble prior to adderall use.  Concentration better on med. She can tell when she doesn't take it.  D/w pt about not using on the weekend.

## 2021-01-11 NOTE — Assessment & Plan Note (Signed)
Tetanus 2015 PNA and shingles not due. Flu shot encouraged.  covid vaccine done 2021 Colon cancer screening not due.  Mammogram done 2022 per patient report.  Done via gyn clinic.  DXA not due.  Pap done 2022 at gyn clinic.   Living will d/w pt. Would have her mother and Nickie Retort equally designated if patient were incapacitated.  Diet and exercise d/w pt, both encouraged.      Prev [facility].

## 2021-01-11 NOTE — Assessment & Plan Note (Signed)
Pain at the right second MCP and also the right first MTP without any redness or swelling at the joints locally.  She has normal inspection but pain on range of motion.  Discussed options.  She does not have a history of gout but it would be reasonable to try colchicine in the meantime, check uric acid, and check plain films of her foot since she is having the most pain there.  Unlikely have a fracture but reasonable to evaluate to see if we get extra information from the x-ray.  She will update me after she tries colchicine.  Routine cautions given to patient.

## 2021-01-11 NOTE — Assessment & Plan Note (Signed)
Better with trazodone.  No ADE on med.  Not needed every night.   Diet and exercise d/w pt.   continue as needed trazodone.

## 2021-01-11 NOTE — Assessment & Plan Note (Signed)
She isn't having menses.  Mild hot flashes, tolerable.  Dw pt.  She'll update me as needed.

## 2021-02-01 ENCOUNTER — Other Ambulatory Visit: Payer: Self-pay | Admitting: Family Medicine

## 2021-02-24 ENCOUNTER — Other Ambulatory Visit: Payer: Self-pay | Admitting: Family Medicine

## 2021-03-29 DIAGNOSIS — D2239 Melanocytic nevi of other parts of face: Secondary | ICD-10-CM | POA: Diagnosis not present

## 2021-04-07 ENCOUNTER — Other Ambulatory Visit: Payer: Self-pay

## 2021-04-07 ENCOUNTER — Encounter: Payer: Self-pay | Admitting: Family Medicine

## 2021-04-07 ENCOUNTER — Ambulatory Visit (INDEPENDENT_AMBULATORY_CARE_PROVIDER_SITE_OTHER): Payer: BC Managed Care – PPO | Admitting: Family Medicine

## 2021-04-07 VITALS — BP 110/70 | HR 91 | Temp 98.0°F | Ht 62.0 in | Wt 136.0 lb

## 2021-04-07 DIAGNOSIS — R7989 Other specified abnormal findings of blood chemistry: Secondary | ICD-10-CM | POA: Diagnosis not present

## 2021-04-07 DIAGNOSIS — L409 Psoriasis, unspecified: Secondary | ICD-10-CM | POA: Insufficient documentation

## 2021-04-07 DIAGNOSIS — L03811 Cellulitis of head [any part, except face]: Secondary | ICD-10-CM

## 2021-04-07 DIAGNOSIS — M2550 Pain in unspecified joint: Secondary | ICD-10-CM

## 2021-04-07 LAB — TSH: TSH: 3.78 mIU/L

## 2021-04-07 MED ORDER — FLUCONAZOLE 150 MG PO TABS: 150.0000 mg | ORAL_TABLET | Freq: Once | ORAL | 0 refills | Status: AC

## 2021-04-07 MED ORDER — AMOXICILLIN-POT CLAVULANATE 875-125 MG PO TABS: 1.0000 | ORAL_TABLET | Freq: Two times a day (BID) | ORAL | 0 refills | Status: DC

## 2021-04-07 NOTE — Patient Instructions (Addendum)
Go to the lab on the way out.   If you have mychart we'll likely use that to update you.     Start augmentin.  Use diflucan if needed.  If fever, progressive swelling, vision change or pain with eye movement then go to the ER.   If you have more joint pain it would be reasonable to see Dr. Patsy Lager.  Take care.  Glad to see you.

## 2021-04-07 NOTE — Progress Notes (Signed)
This visit occurred during the SARS-CoV-2 public health emergency.  Safety protocols were in place, including screening questions prior to the visit, additional usage of staff PPE, and extensive cleaning of exam room while observing appropriate contact time as indicated for disinfecting solutions.  Left eyebrow swollen laterally for x 3 days.  No R sided sx.  She thought it was a pimple initially.  Then yesterday AM with more puffiness.  Tender locally.  No eye redness or discharge.  No vision change.  No FCNAVD.  Normal sensation on the forehead.  No double vision.   Foot and finger pain improved in the meantime.  She didn't think colchicine helped.  She has intermittent pain with dec ROM at the R 2nd MCP.  See avs.    D/w pt about recheck TSH.  See notes on labs.  Meds, vitals, and allergies reviewed.   ROS: Per HPI unless specifically indicated in ROS section   Nad Ncat except for local erythema and swelling along the left eyebrow without fluctuant mass or spreading erythema or drainage.  She has some mild dependent edema below that but still has normal extraocular movements. PERRL EOMI Normal sensation on the face bilaterally.  No dermatomal paresthesia Neck supple, no LA Rrr No dermatomal rash.

## 2021-04-09 DIAGNOSIS — R7989 Other specified abnormal findings of blood chemistry: Secondary | ICD-10-CM | POA: Insufficient documentation

## 2021-04-09 DIAGNOSIS — L0390 Cellulitis, unspecified: Secondary | ICD-10-CM | POA: Insufficient documentation

## 2021-04-09 NOTE — Assessment & Plan Note (Signed)
This looks like local cellulitis in the brow but not an orbital cellulitis.  She does not have any eye involvement in terms of pain on eye movement or double vision or vision changes.  Still okay for outpatient follow-up.  Routine cautions given to patient.  Start Augmentin.  If she has fever, pain with eye movement, spreading erythema, or other considerations then I want her to get rechecked.  She can use Diflucan if she has yeast vaginitis after taking antibiotics.  Update [place] as needed otherwise.

## 2021-04-09 NOTE — Assessment & Plan Note (Signed)
Previous uric acid level was not elevated.  Discussed options.  If she has more joint pain it would be reasonable to see Dr. Patsy Lager to get his input.

## 2021-04-09 NOTE — Assessment & Plan Note (Signed)
See notes on labs. 

## 2021-04-17 ENCOUNTER — Other Ambulatory Visit: Payer: Self-pay | Admitting: Family Medicine

## 2021-04-17 ENCOUNTER — Encounter: Payer: Self-pay | Admitting: Family Medicine

## 2021-04-17 NOTE — Telephone Encounter (Signed)
Refill request for amphetamine-dextroamphetamine (ADDERALL) 30 MG tablet  LOV - 04/07/21 Next OV - not scheduled Last refill - 01/10/21 #30/0

## 2021-04-18 MED ORDER — AMPHETAMINE-DEXTROAMPHETAMINE 30 MG PO TABS
15.0000 mg | ORAL_TABLET | Freq: Two times a day (BID) | ORAL | 0 refills | Status: DC
Start: 2021-04-18 — End: 2021-07-21

## 2021-04-18 MED ORDER — AMPHETAMINE-DEXTROAMPHETAMINE 30 MG PO TABS: 15.0000 mg | ORAL_TABLET | Freq: Two times a day (BID) | ORAL | 0 refills | Status: DC

## 2021-04-18 NOTE — Telephone Encounter (Signed)
Sent. Thanks.   

## 2021-07-21 ENCOUNTER — Other Ambulatory Visit: Payer: Self-pay | Admitting: Family Medicine

## 2021-07-21 NOTE — Telephone Encounter (Signed)
Name of Medication: Adderall 30 mg Name of Pharmacy: [facility] [address] Last Fill or Written Date and Quantity: # 30 x 0 with 2 other rx # 30 on 04/18/21. Last Office Visit and Type: 04/07/21 acute and 01/10/2021 annual exam Next Office Visit and Type: none scheduled Last Controlled Substance Agreement Date: none seen Last UDS: none seen

## 2021-07-23 MED ORDER — AMPHETAMINE-DEXTROAMPHETAMINE 30 MG PO TABS
15.0000 mg | ORAL_TABLET | Freq: Two times a day (BID) | ORAL | 0 refills | Status: DC
Start: 2021-07-23 — End: 2021-10-13

## 2021-07-23 MED ORDER — AMPHETAMINE-DEXTROAMPHETAMINE 30 MG PO TABS: 15.0000 mg | ORAL_TABLET | Freq: Two times a day (BID) | ORAL | 0 refills | Status: DC

## 2021-07-23 NOTE — Telephone Encounter (Signed)
Sent. Thanks.   

## 2021-08-21 ENCOUNTER — Encounter: Payer: Self-pay | Admitting: Family Medicine

## 2021-08-21 NOTE — Telephone Encounter (Signed)
Last office visit 04/07/21 Last refill 01/10/21 #90/3

## 2021-08-22 MED ORDER — TRAZODONE HCL 50 MG PO TABS: 25.0000 mg | ORAL_TABLET | Freq: Every evening | ORAL | 3 refills | Status: AC | PRN

## 2021-10-13 ENCOUNTER — Other Ambulatory Visit: Payer: Self-pay | Admitting: Family Medicine

## 2021-10-13 NOTE — Telephone Encounter (Signed)
Refill request for amphetamine-dextroamphetamine (ADDERALL) 30 MG tablet  LOV - 04/07/21 Next OV - not scheduled Last refill - 07/23/21 #30/0 x 3

## 2021-10-15 MED ORDER — AMPHETAMINE-DEXTROAMPHETAMINE 30 MG PO TABS
15.0000 mg | ORAL_TABLET | Freq: Two times a day (BID) | ORAL | 0 refills | Status: DC
Start: 2021-10-15 — End: 2022-01-17

## 2022-01-17 ENCOUNTER — Other Ambulatory Visit: Payer: Self-pay | Admitting: Family Medicine

## 2022-01-17 MED ORDER — AMPHETAMINE-DEXTROAMPHETAMINE 30 MG PO TABS: 15.0000 mg | ORAL_TABLET | Freq: Two times a day (BID) | ORAL | 0 refills | Status: DC

## 2022-01-17 MED ORDER — AMPHETAMINE-DEXTROAMPHETAMINE 30 MG PO TABS
15.0000 mg | ORAL_TABLET | Freq: Two times a day (BID) | ORAL | 0 refills | Status: DC
Start: 2022-01-17 — End: 2022-04-17

## 2022-01-17 NOTE — Telephone Encounter (Signed)
Refill request for amphetamine-dextroamphetamine (ADDERALL) 30 MG tablet   LOV - 04/07/21 Next OV - not scheduled Last refill - 10/15/21 #30/0 x 3

## 2022-04-17 ENCOUNTER — Other Ambulatory Visit: Payer: Self-pay | Admitting: Family Medicine

## 2022-04-18 MED ORDER — AMPHETAMINE-DEXTROAMPHETAMINE 30 MG PO TABS: 15.0000 mg | ORAL_TABLET | Freq: Two times a day (BID) | ORAL | 0 refills | Status: DC

## 2022-04-18 NOTE — Telephone Encounter (Signed)
Patient has been scheduled.

## 2022-04-18 NOTE — Telephone Encounter (Signed)
Refill request for amphetamine-dextroamphetamine (ADDERALL) 30 MG tablet   LOV - 04/07/21 Next OV - not scheduled; message sent to schedule appt Last refill - 01/17/22 #30/0 x 3

## 2022-04-18 NOTE — Telephone Encounter (Signed)
Patient has not had CPE since 2022; please call to schedule patient.

## 2022-04-26 ENCOUNTER — Ambulatory Visit (INDEPENDENT_AMBULATORY_CARE_PROVIDER_SITE_OTHER): Payer: BC Managed Care – PPO | Admitting: Family Medicine

## 2022-04-26 ENCOUNTER — Encounter: Payer: Self-pay | Admitting: Family Medicine

## 2022-04-26 VITALS — BP 104/62 | HR 86 | Temp 97.6°F | Ht 62.0 in | Wt 131.0 lb

## 2022-04-26 DIAGNOSIS — Z0000 Encounter for general adult medical examination without abnormal findings: Secondary | ICD-10-CM | POA: Diagnosis not present

## 2022-04-26 DIAGNOSIS — F988 Other specified behavioral and emotional disorders with onset usually occurring in childhood and adolescence: Secondary | ICD-10-CM

## 2022-04-26 DIAGNOSIS — G4700 Insomnia, unspecified: Secondary | ICD-10-CM

## 2022-04-26 DIAGNOSIS — Z7189 Other specified counseling: Secondary | ICD-10-CM

## 2022-04-26 LAB — BASIC METABOLIC PANEL
BUN: 12 mg/dL (ref 6–23)
CO2: 29 mEq/L (ref 19–32)
Calcium: 9.5 mg/dL (ref 8.4–10.5)
Chloride: 103 mEq/L (ref 96–112)
Creatinine, Ser: 0.64 mg/dL (ref 0.40–1.20)
GFR: 105.85 mL/min (ref >60.00)
Glucose, Bld: 82 mg/dL (ref 70–99)
Potassium: 4.9 mEq/L (ref 3.5–5.1)
Sodium: 139 mEq/L (ref 135–145)

## 2022-04-26 LAB — TSH: TSH: 3.25 u[IU]/mL (ref 0.35–5.50)

## 2022-04-26 NOTE — Progress Notes (Signed)
CPE- See plan.  Routine anticipatory guidance given to patient.  See health maintenance.  The possibility exists that previously documented standard health maintenance information may have been brought forward from a previous encounter into this note.  If needed, that same information has been updated to reflect the current situation based on today's encounter.    Tetanus 2015 PNA and shingles not due. Flu shot encouraged.  covid vaccine done 2021 D/w patient [id number] for colon cancer screening, including IFOB vs. colonoscopy.  Risks and benefits of both were discussed and patient voiced understanding.  Pt elects to consider.   Mammogram done 2022 per patient report.  Done via gyn clinic.  DXA not due.  Pap per gyn clinic.   Living will d/w pt. Would have her mother and Randel Books equally designated if patient were incapacitated.  Diet and exercise d/w pt, both encouraged.      [facility].    No menses after ablation.    Insomnia d/w pt, improved with trazodone.  No ADE on med. Used prn.  Usually taking '25mg'$  when needed.   Adderall use d/w pt.  No ADE on med.  It helps with concentration, less compared to years ago. Usually taken in the weekdays, not on weekends and she can tell a difference.    See notes on labs.  PMH and SH reviewed  Meds, vitals, and allergies reviewed.   ROS: Per HPI.  Unless specifically indicated otherwise in HPI, the patient denies:  General: fever. Eyes: acute vision changes ENT: sore throat Cardiovascular: chest pain Respiratory: SOB GI: vomiting GU: dysuria Musculoskeletal: acute back pain Derm: acute rash Neuro: acute motor dysfunction Psych: worsening mood Endocrine: polydipsia Heme: bleeding Allergy: hayfever  GEN: nad, alert and oriented HEENT: NCAT NECK: supple w/o LA CV: rrr. PULM: ctab, no inc wob ABD: soft, +bs EXT: no edema SKIN: no acute rash

## 2022-04-26 NOTE — Patient Instructions (Addendum)
Check with gynecology about follow up.  Update me about stool card, cologuard, colonoscopy.  Take care.  Glad to see you. Go to the lab on the way out.   If you have mychart we'll likely use that to update you.

## 2022-04-29 NOTE — Assessment & Plan Note (Signed)
Tetanus 2015 PNA and shingles not due. Flu shot encouraged.  covid vaccine done 2021 D/w patient [id number] for colon cancer screening, including IFOB vs. colonoscopy.  Risks and benefits of both were discussed and patient voiced understanding.  Pt elects to consider.   Mammogram done 2022 per patient report.  Done via gyn clinic.  DXA not due.  Pap per gyn clinic.   Living will d/w pt. Would have her mother and Randel Books equally designated if patient were incapacitated.  Diet and exercise d/w pt, both encouraged.

## 2022-04-29 NOTE — Assessment & Plan Note (Signed)
Insomnia d/w pt, improved with trazodone.  No ADE on med. Used prn.  Usually taking '25mg'$  when needed.  Not used or needed nightly.

## 2022-04-29 NOTE — Assessment & Plan Note (Signed)
Adderall use d/w pt.  No ADE on med.  It helps with concentration, less compared to years ago. Usually taken in the weekdays, not on weekends and she can tell a difference.  Would continue Adderall as is.  She will update me as needed.

## 2022-04-29 NOTE — Assessment & Plan Note (Signed)
Living will d/w pt. Would have her mother and Randel Books equally designated if patient were incapacitated.

## 2022-05-24 ENCOUNTER — Encounter: Payer: Self-pay | Admitting: Family Medicine

## 2022-05-27 ENCOUNTER — Other Ambulatory Visit: Payer: Self-pay | Admitting: Family Medicine

## 2022-05-27 DIAGNOSIS — Z1211 Encounter for screening for malignant neoplasm of colon: Secondary | ICD-10-CM

## 2022-06-26 ENCOUNTER — Encounter: Payer: Self-pay | Admitting: Gastroenterology

## 2022-06-26 ENCOUNTER — Ambulatory Visit (AMBULATORY_SURGERY_CENTER): Payer: BC Managed Care – PPO

## 2022-06-26 VITALS — Ht 60.0 in | Wt 130.0 lb

## 2022-06-26 DIAGNOSIS — Z1211 Encounter for screening for malignant neoplasm of colon: Secondary | ICD-10-CM

## 2022-06-26 MED ORDER — NA SULFATE-K SULFATE-MG SULF 17.5-3.13-1.6 GM/177ML PO SOLN: 1.0000 | Freq: Once | ORAL | 0 refills | Status: AC

## 2022-06-26 NOTE — Progress Notes (Signed)
Pre visit completed via phone call; Patient verified name, DOB, and address;  No egg or soy allergy known to patient  No issues known to pt with past sedation with any surgeries or procedures Patient denies ever being told they had issues or difficulty with intubation  No FH of Malignant Hyperthermia Pt is not on diet pills Pt is not on home 02  Pt is not on blood thinners  Pt denies issues with constipation  No A fib or A flutter Have any cardiac testing pending--NO Pt instructed to use [email or web address] or GoodRx for a price reduction on prep   Insurance verified during PV appt=BCBS   Patient's chart reviewed by John Nulty CNRA prior to previsit and patient appropriate for the [facility].  Previsit completed and red dot placed by patient's name on their procedure day (on provider's schedule).     

## 2022-07-24 ENCOUNTER — Ambulatory Visit (AMBULATORY_SURGERY_CENTER): Payer: BC Managed Care – PPO | Admitting: Gastroenterology

## 2022-07-24 ENCOUNTER — Encounter: Payer: Self-pay | Admitting: Gastroenterology

## 2022-07-24 VITALS — BP 110/78 | HR 78 | Temp 98.4°F | Resp 19 | Ht 60.0 in | Wt 130.0 lb

## 2022-07-24 DIAGNOSIS — Z1211 Encounter for screening for malignant neoplasm of colon: Secondary | ICD-10-CM

## 2022-07-24 MED ORDER — SODIUM CHLORIDE 0.9 % IV SOLN
500.0000 mL | INTRAVENOUS | Status: DC
Start: 2022-07-24 — End: 2022-07-24

## 2022-07-24 NOTE — Progress Notes (Signed)
[name] History and Physical   Primary Care Physician:  Joaquim Nam, MD   Reason for Procedure:   Colon cancer screening  Plan:    Screening colonoscopy     HPI: [name] [name] is a 47 y.o. female undergoing initial average risk screening colonoscopy.  She has no family history of colon cancer.    Past Medical History:  Diagnosis Date   Abnormal uterine bleeding (AUB)    ADHD (attention deficit hyperactivity disorder)    Anxiety    GERD (gastroesophageal reflux disease)    iwth certain foods/random/OTC PRN meds   Gluten intolerance    allergy   Menorrhagia    Overactive bladder    that is triggered by stress   Wears contact lenses     Past Surgical History:  Procedure Laterality Date   BREAST ENHANCEMENT SURGERY Bilateral 06/2014   DILITATION & CURRETTAGE/HYSTROSCOPY WITH NOVASURE ABLATION N/A 02/20/2016   Procedure: DILATATION & CURETTAGE/HYSTEROSCOPY WITH NOVASURE ABLATION;  Surgeon: Richardean Chimera, MD;  Location: [facility] [place];  Service: Gynecology;  Laterality: N/A;   IUD REMOVAL N/A 02/20/2016   Procedure: INTRAUTERINE DEVICE (IUD) REMOVAL;  Surgeon: Richardean Chimera, MD;  Location: [facility] [place];  Service: Gynecology;  Laterality: N/A;   LAPAROSCOPIC TUBAL LIGATION Bilateral 02/20/2016   Procedure: LAPAROSCOPIC TUBAL LIGATION;  Surgeon: Richardean Chimera, MD;  Location: [facility] [place];  Service: Gynecology;  Laterality: Bilateral;    Prior to Admission medications   Medication Sig Start Date End Date Taking? Authorizing Provider  amphetamine-dextroamphetamine (ADDERALL) 30 MG tablet Take 0.5 tablets by mouth 2 (two) times daily. Fill on/after 04/27/22 04/18/22  Yes Joaquim Nam, MD  traZODone (DESYREL) 50 MG tablet Take 0.5-1 tablets (25-50 mg total) by mouth at bedtime as needed for sleep. 08/22/21  Yes Joaquim Nam, MD    Current Outpatient Medications  Medication Sig Dispense Refill    amphetamine-dextroamphetamine (ADDERALL) 30 MG tablet Take 0.5 tablets by mouth 2 (two) times daily. Fill on/after 04/27/22 30 tablet 0   traZODone (DESYREL) 50 MG tablet Take 0.5-1 tablets (25-50 mg total) by mouth at bedtime as needed for sleep. 90 tablet 3   Current Facility-Administered Medications  Medication Dose Route Frequency Provider Last Rate Last Admin   0.9 %  sodium chloride infusion  500 mL Intravenous Continuous Jenel Lucks, MD        Allergies as of 07/24/2022   (No Known Allergies)    Family History  Problem Relation Age of Onset   Multiple sclerosis Maternal Aunt    Cancer Maternal Aunt        unknown type   Arthritis Other        Grandparents   Lung cancer Other        grandparent   Breast cancer Neg Hx    Colon cancer Neg Hx    Colon polyps Neg Hx    Esophageal cancer Neg Hx    Stomach cancer Neg Hx    Rectal cancer Neg Hx     Social History   Socioeconomic History   Marital status: Married    Spouse name: Not on file   Number of children: Not on file   Years of education: Not on file   Highest education level: Not on file  Occupational History   Not on file  Tobacco Use   Smoking status: Former    Years: 6    Types: Cigarettes    Quit date: 12/03/2005  Years since quitting: 16.6   Smokeless tobacco: Never  Vaping Use   Vaping Use: Never used  Substance and Sexual Activity   Alcohol use: Yes    Alcohol/week: 0.0 - 4.0 standard drinks of alcohol    Comment: 1 per day or less   Drug use: Yes    Types: Marijuana   Sexual activity: Yes  Other Topics Concern   Not on file  Social History Narrative   Occupation: IT consultant, Multimedia programmer estate work.     G3P2-- 2 daughters   Divorced 2020   Remarried 01/2022   Former Smoker (quit 12/2005).   Social Determinants of Health   Financial Resource Strain: Not on file  Food Insecurity: Not on file  Transportation Needs: Not on file  Physical Activity: Not on file  Stress: Not on  file  Social Connections: Not on file  Intimate Partner Violence: Not on file    Review of Systems:  All other review of systems negative except as mentioned in the HPI.  Physical Exam: Vital signs BP 106/60   Pulse 77   Temp 98.4 F (36.9 C) (Skin)   Ht 5' (1.524 m)   Wt 130 lb (59 kg)   SpO2 99%   BMI 25.39 kg/m   General:   Alert,  Well-developed, well-nourished, pleasant and cooperative in NAD Airway:  Mallampati 2 Lungs:  Clear throughout to auscultation.   Heart:  Regular rate and rhythm; no murmurs, clicks, rubs,  or gallops. Abdomen:  Soft, nontender and nondistended. Normal bowel sounds.   Neuro/Psych:  Normal mood and affect. A and O x 3   Niamya Vittitow E. Tomasa Rand, MD [facility]

## 2022-07-24 NOTE — Patient Instructions (Addendum)
Resume previous diet Continue present medications There were no colon polyps seen today!   You will need another screening colonoscopy in 10 years, you will receive a letter at that time when you are due for the procedure.  Please call us at [phone] if you have a change in bowel habits, change in family history of colo-rectal cancer, rectal bleeding or other GI concern before that time.  YOU HAD AN ENDOSCOPIC PROCEDURE TODAY AT THE  ENDOSCOPY CENTER:   Refer to the procedure report that was given to you for any specific questions about what was found during the examination.  If the procedure report does not answer your questions, please call your gastroenterologist to clarify.  If you requested that your care partner not be given the details of your procedure findings, then the procedure report has been included in a sealed envelope for you to review at your convenience later.  YOU SHOULD EXPECT: Some feelings of bloating in the abdomen. Passage of more gas than usual.  Walking can help get rid of the air that was put into your GI tract during the procedure and reduce the bloating. If you had a lower endoscopy (such as a colonoscopy or flexible sigmoidoscopy) you may notice spotting of blood in your stool or on the toilet paper. If you underwent a bowel prep for your procedure, you may not have a normal bowel movement for a few days.  Please Note:  You might notice some irritation and congestion in your nose or some drainage.  This is from the oxygen used during your procedure.  There is no need for concern and it should clear up in a day or so.  SYMPTOMS TO REPORT IMMEDIATELY:  Following lower endoscopy (colonoscopy):  Excessive amounts of blood in the stool  Significant tenderness or worsening of abdominal pains  Swelling of the abdomen that is new, acute  Fever of 100F or higher  For urgent or emergent issues, a gastroenterologist can be reached at any hour by calling [phone]  [phone]. Do not use MyChart messaging for urgent concerns.   DIET:  We do recommend a small meal at first, but then you may proceed to your regular diet.  Drink plenty of fluids but you should avoid alcoholic beverages for 24 hours.  ACTIVITY:  You should plan to take it easy for the rest of today and you should NOT DRIVE or use heavy machinery until tomorrow (because of the sedation medicines used during the test).    FOLLOW UP: Our staff will call the number listed on your records the next business day following your procedure.  We will call around 7:15- 8:00 am to check on you and address any questions or concerns that you may have regarding the information given to you following your procedure. If we do not reach you, we will leave a message.     SIGNATURES/CONFIDENTIALITY: You and/or your care partner have signed paperwork which will be entered into your electronic medical record.  These signatures attest to the fact that that the information above on your After Visit Summary has been reviewed and is understood.  Full responsibility of the confidentiality of this discharge information lies with you and/or your care-partner.

## 2022-07-24 NOTE — Progress Notes (Signed)
Pt's states no medical or surgical changes since previsit or office visit. 

## 2022-07-24 NOTE — Op Note (Signed)
[facility] Patient Name: [name] [name] Procedure Date: 07/24/2022 9:28 AM MRN: [id number] Endoscopist: Lorin Picket E. Tomasa Rand , MD, [id number] Age: 47 Referring MD:  Date of Birth: November 12, 1975 Gender: Female Account #: [id number] Procedure:                Colonoscopy Indications:              Screening for colorectal malignant neoplasm, This                            is the patient's first colonoscopy Medicines:                Monitored Anesthesia Care Procedure:                Pre-Anesthesia Assessment:                           - Prior to the procedure, a History and Physical                            was performed, and patient medications and                            allergies were reviewed. The patient's tolerance of                            previous anesthesia was also reviewed. The risks                            and benefits of the procedure and the sedation                            options and risks were discussed with the patient.                            All questions were answered, and informed consent                            was obtained. Prior Anticoagulants: The patient has                            taken no anticoagulant or antiplatelet agents. ASA                            Grade Assessment: II - A patient with mild systemic                            disease. After reviewing the risks and benefits,                            the patient was deemed in satisfactory condition to                            undergo the procedure.  After obtaining informed consent, the colonoscope                            was passed under direct vision. Throughout the                            procedure, the patient's blood pressure, pulse, and                            oxygen saturations were monitored continuously. The                            CF [id number] #[id number] was introduced through the anus                            and advanced  to the the terminal ileum, with                            identification of the appendiceal orifice and IC                            valve. The colonoscopy was performed without                            difficulty. The patient tolerated the procedure                            well. The quality of the bowel preparation was                            adequate. The terminal ileum, ileocecal valve,                            appendiceal orifice, and rectum were photographed.                            The bowel preparation used was SUPREP via split                            dose instruction. Scope In: 9:43:13 AM Scope Out: 9:59:54 AM Scope Withdrawal Time: 0 hours 10 minutes 44 seconds  Total Procedure Duration: 0 hours 16 minutes 41 seconds  Findings:                 A single skin tag was found on perianal exam.                           The digital rectal exam revealed a 2 cm (diameter)                            rubbery-textured, mobile and smooth anal lesion.                            The mass was non-circumferential and located  predominantly at the right-posterior wall.                           The colon (entire examined portion) appeared normal.                           A 10 mm scar was found in the distal rectum. The                            scar was unremarkable in appearance.                           The terminal ileum appeared normal.                           A large polypoid lesion was found at the dentate                            line, corresponding to the lesion palpated on                            digital exam.                           No additional abnormalities were found on                            retroflexion. Complications:            No immediate complications. Estimated Blood Loss:     Estimated blood loss: none. Impression:               - Perianal skin tags found on perianal exam.                           - The entire  examined colon is normal.                           - Scar in the distal rectum. Query history of                            hemorrhoid ligation.                           - The examined portion of the ileum was normal.                           - Benign polypoid lesion at the anus. This seems                            most consistent with a hypertrophied anal papilla                           - No specimens collected. Recommendation:           - Patient has a contact number available for  emergencies. The signs and symptoms of potential                            delayed complications were discussed with the                            patient. Return to normal activities tomorrow.                            Written discharge instructions were provided to the                            patient.                           - Resume previous diet.                           - Continue present medications.                           - Repeat colonoscopy in 10 years for screening                            purposes.                           - Refer to colorectal surgeon to consider removal                            of large polypoid lesion at dentate line. Duante Arocho E. Tomasa Rand, MD 07/24/2022 10:11:17 AM This report has been signed electronically.

## 2022-07-25 ENCOUNTER — Telehealth: Payer: Self-pay | Admitting: *Deleted

## 2022-07-25 NOTE — Telephone Encounter (Signed)
No answer for post procedure call back. Left VM. 

## 2022-07-31 ENCOUNTER — Other Ambulatory Visit: Payer: Self-pay | Admitting: Family Medicine

## 2022-07-31 ENCOUNTER — Encounter: Payer: Self-pay | Admitting: Family Medicine

## 2022-07-31 ENCOUNTER — Encounter: Payer: Self-pay | Admitting: Gastroenterology

## 2022-07-31 NOTE — Telephone Encounter (Signed)
LAST APPOINTMENT DATE: 04/26/2022   NEXT APPOINTMENT DATE: Visit date not found UDS : none  Contract : none

## 2022-08-01 MED ORDER — AMPHETAMINE-DEXTROAMPHETAMINE 30 MG PO TABS: 15.0000 mg | ORAL_TABLET | Freq: Two times a day (BID) | ORAL | 0 refills | Status: DC

## 2022-08-01 NOTE — Telephone Encounter (Signed)
Sent rx x3.  Thanks.  

## 2022-08-08 IMAGING — DX DG FOOT COMPLETE 3+V*R*
3 series · 3 of 3 positions shown · non-contrast
Comparison: None.

CLINICAL DATA: Pain at the first metatarsal phalangeal joint.

EXAM:
RIGHT FOOT COMPLETE - 3+ VIEW

[foot ap]
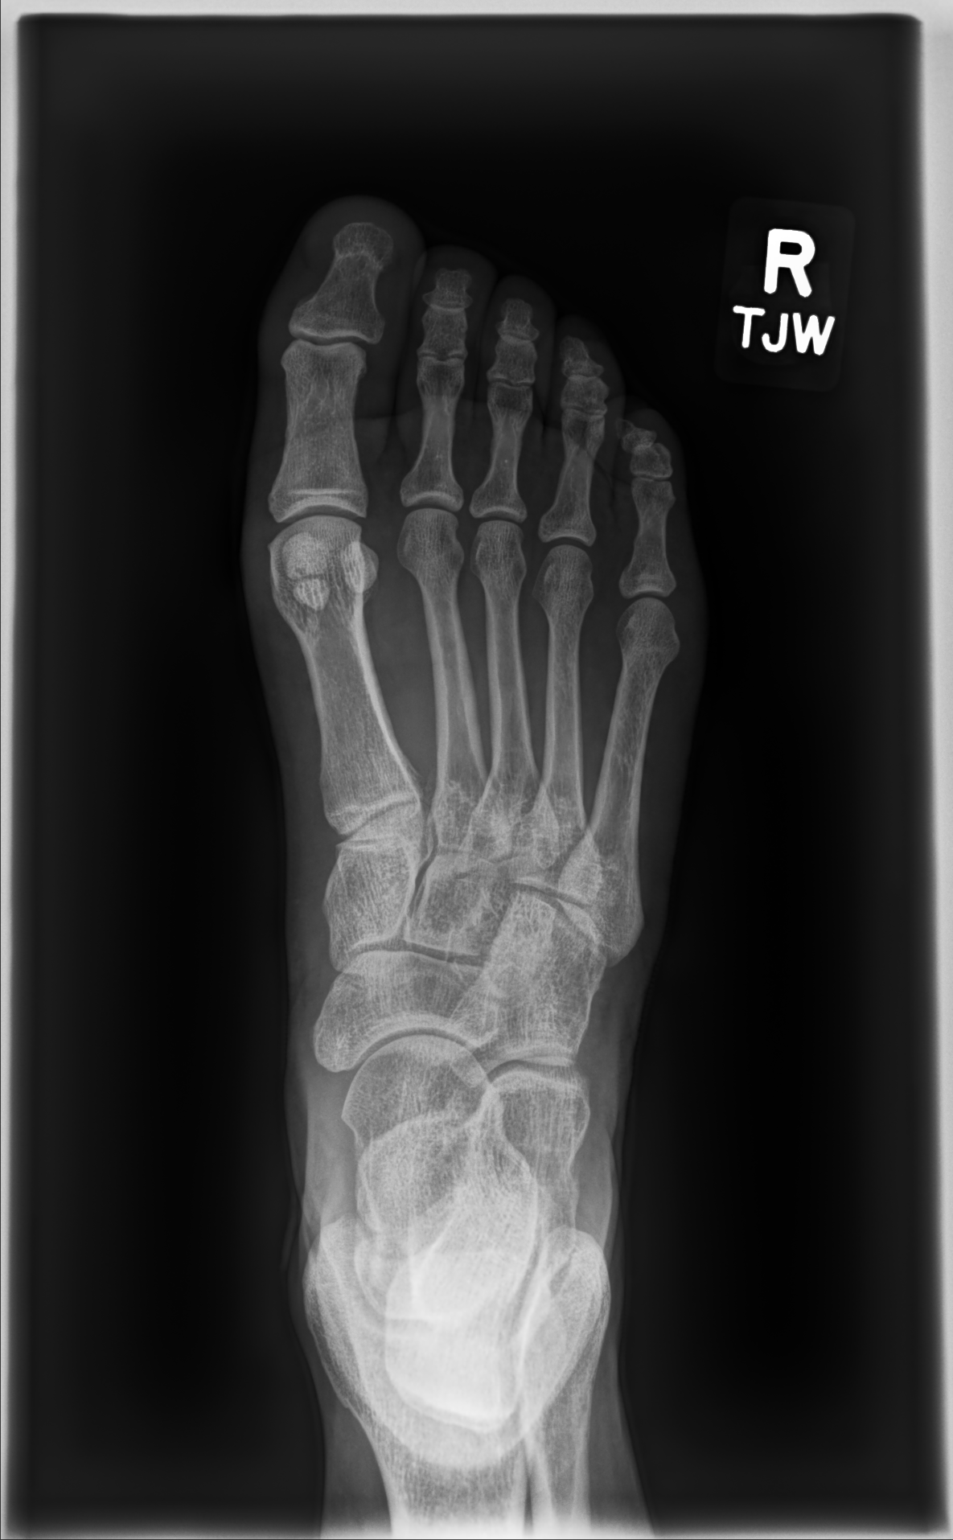

[foot mlo]
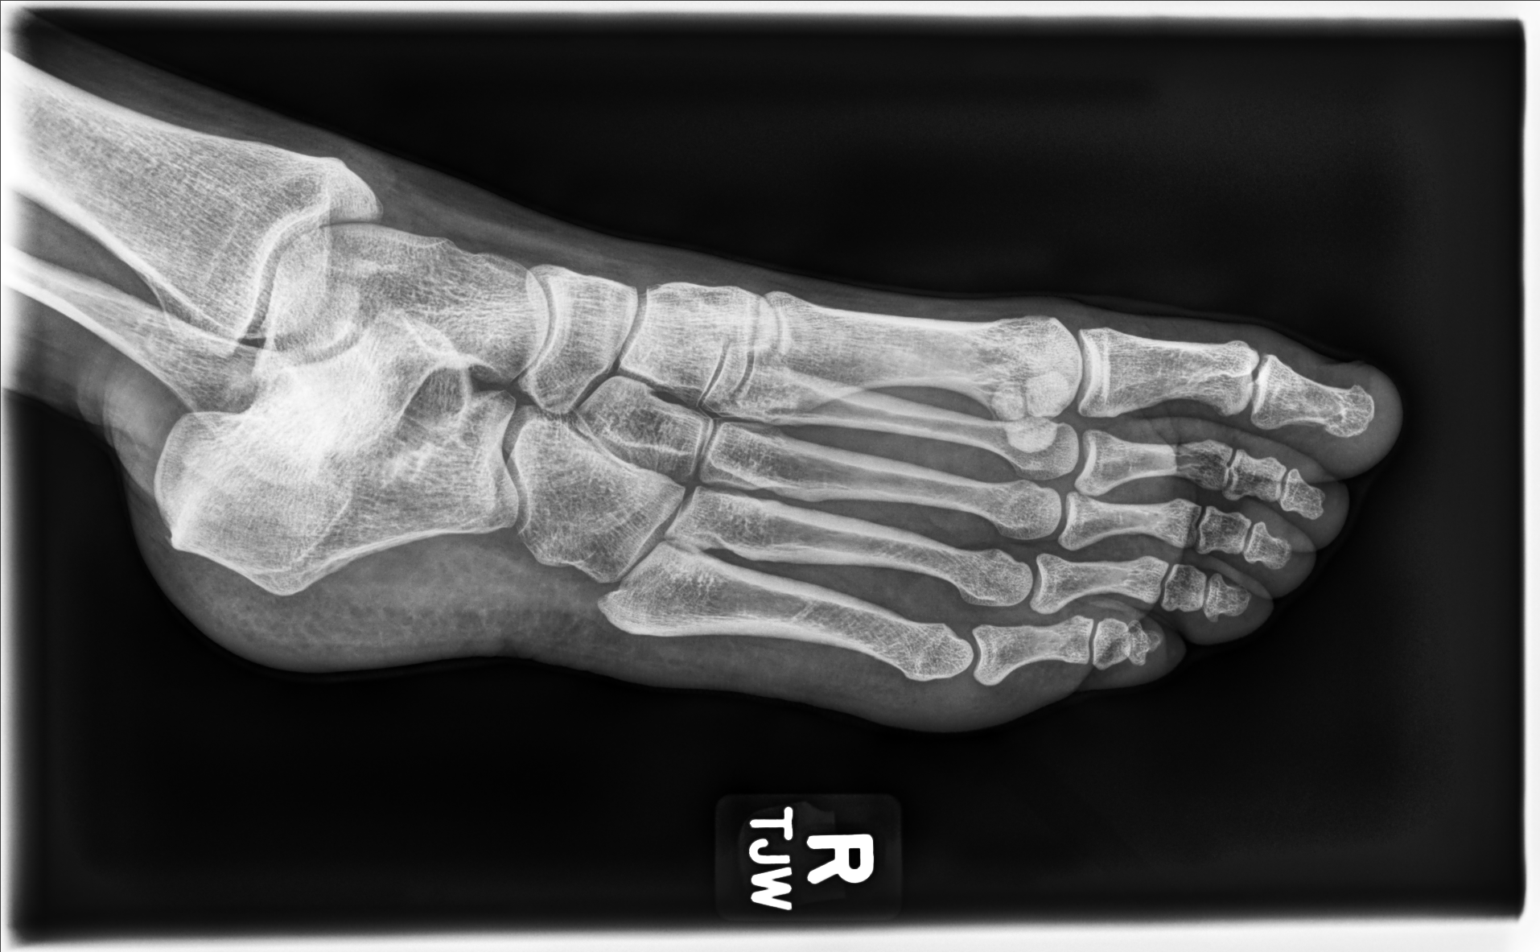

[foot lat]
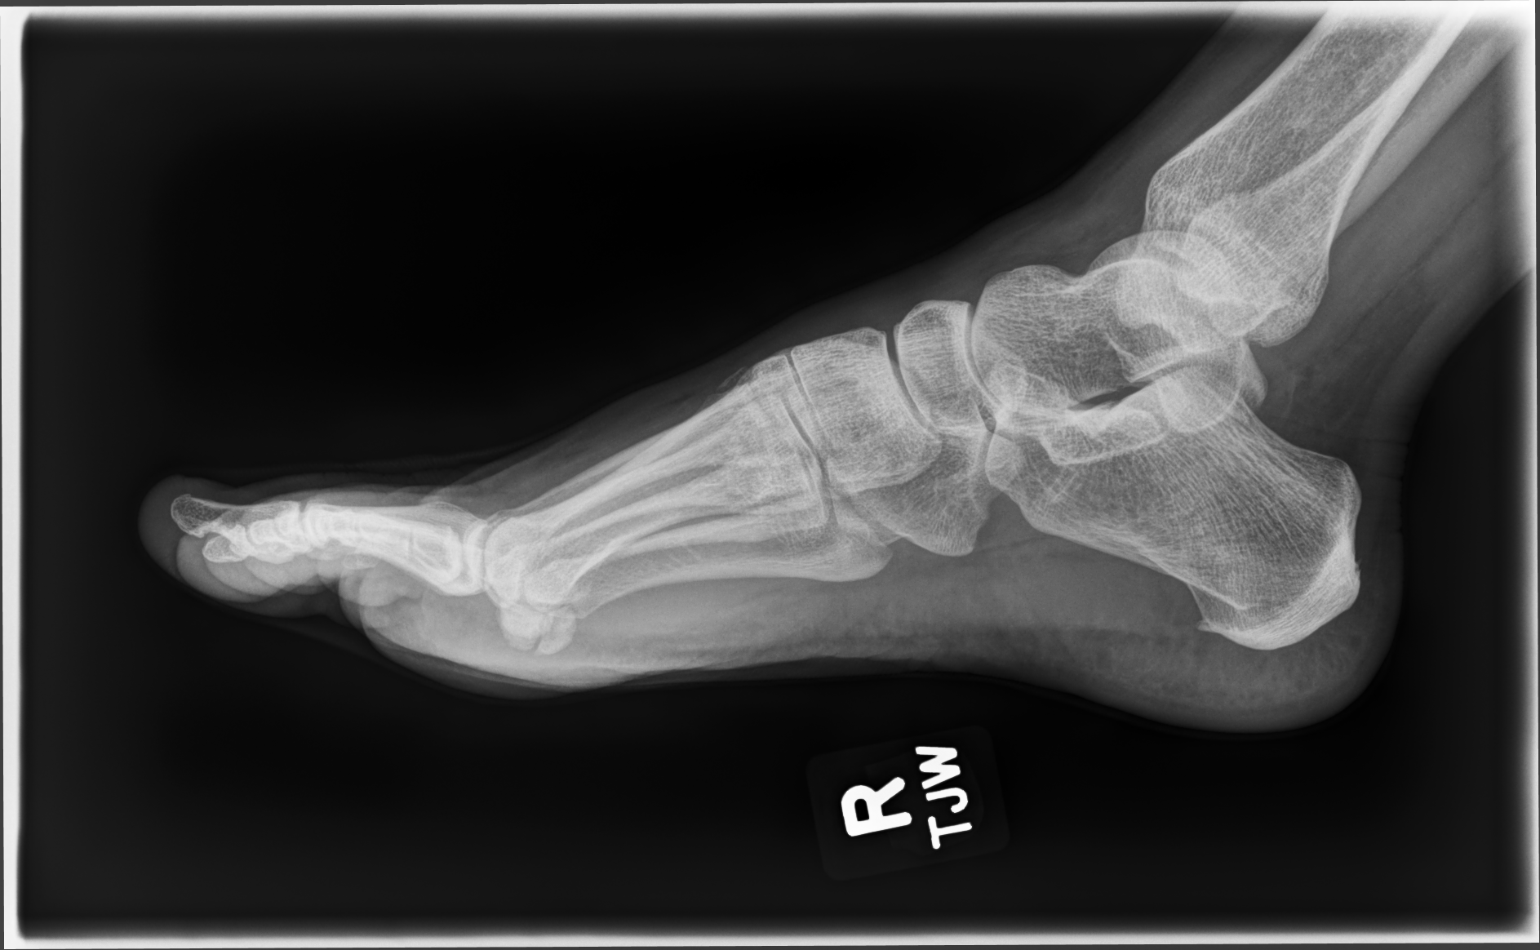

[3 of 3 positions shown; findings below may reference images not displayed]

FINDINGS: Normal alignment. No significant hallux valgus. The joint spaces are
preserved. Bipartite tibial sesamoid, typically incidental. No
fracture, erosion, periosteal reaction or focal bone abnormality.
Tiny plantar calcaneal spur. No soft tissue abnormalities are seen.
IMPRESSION: Tiny plantar calcaneal spur. Otherwise negative radiographs of the
right foot.

## 2022-08-20 ENCOUNTER — Ambulatory Visit: Payer: Self-pay | Admitting: General Surgery

## 2022-08-20 DIAGNOSIS — K621 Rectal polyp: Secondary | ICD-10-CM | POA: Diagnosis not present

## 2022-08-20 DIAGNOSIS — K644 Residual hemorrhoidal skin tags: Secondary | ICD-10-CM | POA: Diagnosis not present

## 2022-08-20 NOTE — H&P (Signed)
REFERRING PHYSICIAN:  Elease Etienne*   PROVIDER:  Elenora Gamma, MD   MRN: [id number] DOB: 10-20-1975 DATE OF ENCOUNTER: 08/20/2022   Subjective   Chief Complaint: New Consultation       History of Present Illness: [name] [name] is a 47 y.o. female who is seen today as an office consultation at the request of Dr. Tomasa Rand for evaluation of New Consultation .  Patient presents to the office after recent colonoscopy showed a distal rectal polyp involving the dentate line.  She is here to discuss transanal excision.  She also has a small anal skin tag from a previous thrombosed hemorrhoid that she would like excised during the same time.     Review of Systems: A complete review of systems was obtained from the patient.  I have reviewed this information and discussed as appropriate with the patient.  See HPI as well for other ROS.       Medical History: Past Medical History Past Medical History: Diagnosis Date  Anxiety    GERD (gastroesophageal reflux disease)         Problem List There is no problem list on file for this patient.     Past Surgical History Past Surgical History: Procedure Laterality Date  breast surgery   2016  LAPAROSCOPIC TUBAL LIGATION   2017      Allergies No Known Allergies    Medications Ordered Prior to Encounter Current Outpatient Medications on File Prior to Visit Medication Sig Dispense Refill  dextroamphetamine-amphetamine (ADDERALL) 30 mg tablet Take 0.5 tablets by mouth 2 (two) times daily      traZODone (DESYREL) 50 MG tablet Take 25-50 mg by mouth at bedtime as needed        No current facility-administered medications on file prior to visit.      Family History Family History Problem Relation Age of Onset  Skin cancer Mother        Tobacco Use History Social History    Tobacco Use Smoking Status Former  Types: Cigarettes  Start date: 2007 Smokeless Tobacco Never      Social  History Social History    Socioeconomic History  Marital status: Married Tobacco Use  Smoking status: Former     Types: Cigarettes     Start date: 2007  Smokeless tobacco: Never Substance and Sexual Activity  Alcohol use: Yes  Drug use: Yes      Objective:     Vitals:   08/20/22 1513 BP: 133/82 Pulse: 86 Temp: 36.7 C (98.1 F) SpO2: 97% Weight: 61.1 kg (134 lb 12.8 oz) Height: 152.4 cm (5') PainSc: 0-No pain PainLoc: Rectum     Exam Gen: NAD Abd: soft Rectal: Palpable anterior rectal mass approximately 1 cm in diameter     Labs, Imaging and Diagnostic Testing:     Assessment and Plan: Diagnoses and all orders for this visit:   Anal skin tag Small posterior anal skin tag.  I believe this can be easily excised during surgery. Rectal polyp Palpable anterior distal rectal mass approximately 1 cm in diameter     I have recommended exam under anesthesia with transanal excision of her rectal polyp and excision of the skin tag at the same time.  We discussed the typical postoperative pain and bleeding.  We discussed typical recovery times.  All questions were answered.  Patient would like to proceed with surgery.   [name] Panda, MD Colon and Rectal Surgery [facility]

## 2022-09-11 ENCOUNTER — Encounter: Payer: Self-pay | Admitting: Family Medicine

## 2022-09-11 ENCOUNTER — Ambulatory Visit (INDEPENDENT_AMBULATORY_CARE_PROVIDER_SITE_OTHER): Payer: BC Managed Care – PPO | Admitting: Family Medicine

## 2022-09-11 ENCOUNTER — Ambulatory Visit (INDEPENDENT_AMBULATORY_CARE_PROVIDER_SITE_OTHER)
Admission: RE | Admit: 2022-09-11 | Discharge: 2022-09-11 | Disposition: A | Discharge status: Home / Self Care | Payer: BC Managed Care – PPO | Source: Ambulatory Visit | Attending: Family Medicine | Admitting: Family Medicine

## 2022-09-11 VITALS — BP 124/78 | HR 100 | Temp 98.7°F | Ht 60.0 in | Wt 136.0 lb

## 2022-09-11 DIAGNOSIS — M25512 Pain in left shoulder: Secondary | ICD-10-CM

## 2022-09-11 MED ORDER — MELOXICAM 15 MG PO TABS: 15.0000 mg | ORAL_TABLET | Freq: Every day | ORAL | 1 refills | Status: AC

## 2022-09-11 NOTE — Patient Instructions (Signed)
Try meloxicam with food.   Xray on the way out.  Likely ortho- we'll be in touch  Take care.  Glad to see you.

## 2022-09-11 NOTE — Progress Notes (Signed)
L shoulder pain.  Going on for 2 years.  She was recently putting in flooring, pushing down to cut the flooring and had new/worse pain on the L shoulder.  Pain is at the lateral portion of the proximal humerus.  Sig pain on ROM, minimally better in the meantime. Pain at rest.  Didn't feel a pop.   No bruising.  No R shoulder pain.  Ibuprofen 800mg  helped some with pain.  NSAID cautions discussed with patient.  Meds, vitals, and allergies reviewed.   ROS: Per HPI unless specifically indicated in ROS section   Nad Ncat Neck supple, no LA No restriction on right arm range of motion. She has pain at the left shoulder with movement overhead, suprastainatus testing is positive.  Pain with internal and external rotation. Biceps without pain on testing.   Distally neurovascular intact.  No bruising on the shoulder.

## 2022-09-12 ENCOUNTER — Encounter: Payer: Self-pay | Admitting: Family Medicine

## 2022-09-12 NOTE — Assessment & Plan Note (Signed)
We talked about anatomy and likely rotator cuff findings.  Reasonable to check plain films today, start meloxicam with routine GI/NSAID cautions and refer to orthopedics. Would prefer [facility].  [facility].

## 2022-09-14 ENCOUNTER — Encounter: Payer: Self-pay | Admitting: *Deleted

## 2022-09-25 DIAGNOSIS — M25512 Pain in left shoulder: Secondary | ICD-10-CM | POA: Diagnosis not present

## 2022-10-08 ENCOUNTER — Encounter (HOSPITAL_BASED_OUTPATIENT_CLINIC_OR_DEPARTMENT_OTHER): Payer: Self-pay | Admitting: General Surgery

## 2022-10-08 NOTE — Progress Notes (Signed)
Spoke w/ via phone for pre-op interview--- pt Lab needs dos----     urine preg          Lab results------ no COVID test -----patient states asymptomatic no test needed Arrive at ------- 0530 on 10-18-2022 NPO after MN NO Solid Food.  Clear liquids from MN until--- 0430 Med rec completed Medications to take morning of surgery ----- pepcid , if you have any at home Diabetic medication -----  n/a Patient instructed no nail polish to be worn day of surgery Patient instructed to bring photo id and insurance card day of surgery Patient aware to have Driver (ride ) / caregiver    for 24 hours after surgery-- husband, ron  Patient Special Instructions ----- n/a Pre-Op special Instructions ----- n/a Patient verbalized understanding of instructions that were given at this phone interview. Patient denies shortness of breath, chest pain, fever, cough at this phone interview.

## 2022-10-18 ENCOUNTER — Encounter (HOSPITAL_BASED_OUTPATIENT_CLINIC_OR_DEPARTMENT_OTHER)
Admission: RE | Disposition: A | Discharge status: Home / Self Care | Payer: Self-pay | Source: Home / Self Care | Attending: General Surgery

## 2022-10-18 ENCOUNTER — Ambulatory Visit (HOSPITAL_BASED_OUTPATIENT_CLINIC_OR_DEPARTMENT_OTHER)
Admission: RE | Admit: 2022-10-18 | Discharge: 2022-10-18 | Disposition: A | Discharge status: Home / Self Care | Payer: BC Managed Care – PPO | Attending: General Surgery | Admitting: General Surgery

## 2022-10-18 ENCOUNTER — Other Ambulatory Visit: Payer: Self-pay

## 2022-10-18 ENCOUNTER — Encounter (HOSPITAL_BASED_OUTPATIENT_CLINIC_OR_DEPARTMENT_OTHER): Payer: Self-pay | Admitting: General Surgery

## 2022-10-18 ENCOUNTER — Ambulatory Visit (HOSPITAL_BASED_OUTPATIENT_CLINIC_OR_DEPARTMENT_OTHER): Payer: BC Managed Care – PPO | Admitting: Certified Registered Nurse Anesthetist

## 2022-10-18 ENCOUNTER — Ambulatory Visit (HOSPITAL_BASED_OUTPATIENT_CLINIC_OR_DEPARTMENT_OTHER): Payer: Self-pay | Admitting: Certified Registered Nurse Anesthetist

## 2022-10-18 DIAGNOSIS — F909 Attention-deficit hyperactivity disorder, unspecified type: Secondary | ICD-10-CM | POA: Insufficient documentation

## 2022-10-18 DIAGNOSIS — K644 Residual hemorrhoidal skin tags: Secondary | ICD-10-CM | POA: Insufficient documentation

## 2022-10-18 DIAGNOSIS — Z01818 Encounter for other preprocedural examination: Secondary | ICD-10-CM

## 2022-10-18 DIAGNOSIS — K621 Rectal polyp: Secondary | ICD-10-CM | POA: Diagnosis not present

## 2022-10-18 DIAGNOSIS — K649 Unspecified hemorrhoids: Secondary | ICD-10-CM | POA: Diagnosis not present

## 2022-10-18 DIAGNOSIS — Z87891 Personal history of nicotine dependence: Secondary | ICD-10-CM | POA: Insufficient documentation

## 2022-10-18 DIAGNOSIS — K648 Other hemorrhoids: Secondary | ICD-10-CM | POA: Insufficient documentation

## 2022-10-18 DIAGNOSIS — G4700 Insomnia, unspecified: Secondary | ICD-10-CM | POA: Diagnosis not present

## 2022-10-18 HISTORY — PX: TRANSANAL EXCISION OF RECTAL MASS: SHX6134

## 2022-10-18 HISTORY — PX: EXCISION OF SKIN TAG: SHX6270

## 2022-10-18 HISTORY — DX: Rectal polyp: K62.1

## 2022-10-18 HISTORY — DX: Residual hemorrhoidal skin tags: K64.4

## 2022-10-18 LAB — POCT PREGNANCY, URINE: Preg Test, Ur: NEGATIVE

## 2022-10-18 SURGERY — EXCISION, MASS, RECTUM, ANAL APPROACH
Anesthesia: Monitor Anesthesia Care

## 2022-10-18 MED ORDER — 0.9 % SODIUM CHLORIDE (POUR BTL) OPTIME
TOPICAL | Status: DC | PRN
  Administered 2022-10-18: 500 mL

## 2022-10-18 MED ORDER — MIDAZOLAM HCL 2 MG/2ML IJ SOLN
INTRAMUSCULAR | Status: DC | PRN
  Administered 2022-10-18: 1 mg via INTRAVENOUS

## 2022-10-18 MED ORDER — LACTATED RINGERS IV SOLN: INTRAVENOUS | Status: DC

## 2022-10-18 MED ORDER — GABAPENTIN 300 MG PO CAPS
ORAL_CAPSULE | ORAL | Status: AC
  Filled 2022-10-18: qty 1

## 2022-10-18 MED ORDER — MIDAZOLAM HCL 2 MG/2ML IJ SOLN
INTRAMUSCULAR | Status: AC
  Filled 2022-10-18: qty 2

## 2022-10-18 MED ORDER — CELECOXIB 200 MG PO CAPS
ORAL_CAPSULE | ORAL | Status: AC
  Filled 2022-10-18: qty 1

## 2022-10-18 MED ORDER — PROPOFOL 500 MG/50ML IV EMUL
INTRAVENOUS | Status: DC | PRN
  Administered 2022-10-18: 180 ug/kg/min via INTRAVENOUS

## 2022-10-18 MED ORDER — CELECOXIB 200 MG PO CAPS
200.0000 mg | ORAL_CAPSULE | ORAL | Status: AC
  Administered 2022-10-18: 200 mg via ORAL

## 2022-10-18 MED ORDER — FENTANYL CITRATE (PF) 100 MCG/2ML IJ SOLN: 25.0000 ug | INTRAMUSCULAR | Status: DC | PRN

## 2022-10-18 MED ORDER — ACETAMINOPHEN 500 MG PO TABS
ORAL_TABLET | ORAL | Status: AC
  Filled 2022-10-18: qty 2

## 2022-10-18 MED ORDER — LIDOCAINE HCL 4 % EX SOLN: Freq: Three times a day (TID) | CUTANEOUS | 1 refills | Status: DC | PRN

## 2022-10-18 MED ORDER — SODIUM CHLORIDE 0.9% FLUSH: 3.0000 mL | Freq: Two times a day (BID) | INTRAVENOUS | Status: DC

## 2022-10-18 MED ORDER — PROPOFOL 10 MG/ML IV BOLUS
INTRAVENOUS | Status: AC
  Filled 2022-10-18: qty 20

## 2022-10-18 MED ORDER — GABAPENTIN 300 MG PO CAPS
300.0000 mg | ORAL_CAPSULE | ORAL | Status: AC
  Administered 2022-10-18: 300 mg via ORAL

## 2022-10-18 MED ORDER — PROPOFOL 10 MG/ML IV BOLUS
INTRAVENOUS | Status: DC | PRN
Start: 2022-10-18 — End: 2022-10-18
  Administered 2022-10-18: 20 mg via INTRAVENOUS

## 2022-10-18 MED ORDER — BUPIVACAINE-EPINEPHRINE 0.25% -1:200000 IJ SOLN
INTRAMUSCULAR | Status: DC | PRN
Start: 2022-10-18 — End: 2022-10-18
  Administered 2022-10-18: 30 mL

## 2022-10-18 MED ORDER — FENTANYL CITRATE (PF) 100 MCG/2ML IJ SOLN
INTRAMUSCULAR | Status: AC
  Filled 2022-10-18: qty 2

## 2022-10-18 MED ORDER — ACETAMINOPHEN 500 MG PO TABS
1000.0000 mg | ORAL_TABLET | ORAL | Status: AC
  Administered 2022-10-18: 1000 mg via ORAL

## 2022-10-18 MED ORDER — ONDANSETRON HCL 4 MG/2ML IJ SOLN
INTRAMUSCULAR | Status: DC | PRN
Start: 2022-10-18 — End: 2022-10-18
  Administered 2022-10-18: 4 mg via INTRAVENOUS

## 2022-10-18 MED ORDER — BUPIVACAINE LIPOSOME 1.3 % IJ SUSP: 20.0000 mL | Freq: Once | INTRAMUSCULAR | Status: DC

## 2022-10-18 MED ORDER — FENTANYL CITRATE (PF) 250 MCG/5ML IJ SOLN
INTRAMUSCULAR | Status: DC | PRN
  Administered 2022-10-18: 50 ug via INTRAVENOUS

## 2022-10-18 MED ORDER — BUPIVACAINE LIPOSOME 1.3 % IJ SUSP
INTRAMUSCULAR | Status: DC | PRN
Start: 2022-10-18 — End: 2022-10-18
  Administered 2022-10-18: 20 mL

## 2022-10-18 MED ORDER — DEXMEDETOMIDINE HCL IN NACL 80 MCG/20ML IV SOLN
INTRAVENOUS | Status: DC | PRN
  Administered 2022-10-18: 8 ug via INTRAVENOUS

## 2022-10-18 MED ORDER — PHENYLEPHRINE 80 MCG/ML (10ML) SYRINGE FOR IV PUSH (FOR BLOOD PRESSURE SUPPORT)
PREFILLED_SYRINGE | INTRAVENOUS | Status: DC | PRN
  Administered 2022-10-18 (×2): 80 ug via INTRAVENOUS

## 2022-10-18 MED ORDER — LIDOCAINE 2% (20 MG/ML) 5 ML SYRINGE
INTRAMUSCULAR | Status: DC | PRN
  Administered 2022-10-18: 20 mg via INTRAVENOUS

## 2022-10-18 MED ORDER — OXYCODONE HCL 5 MG PO TABS: 5.0000 mg | ORAL_TABLET | Freq: Four times a day (QID) | ORAL | 0 refills | Status: DC | PRN

## 2022-10-18 SURGICAL SUPPLY — 54 items
ADH SKN CLS APL DERMABOND .7 (GAUZE/BANDAGES/DRESSINGS)
APL PRP STRL LF DISP 70% ISPRP (MISCELLANEOUS)
APL SKNCLS STERI-STRIP NONHPOA (GAUZE/BANDAGES/DRESSINGS) ×1
BENZOIN TINCTURE PRP APPL 2/3 (GAUZE/BANDAGES/DRESSINGS) ×1 IMPLANT
BLADE CLIPPER SENSICLIP SURGIC (BLADE) IMPLANT
BLADE EXTENDED COATED 6.5IN (ELECTRODE) IMPLANT
BLADE SURG 10 STRL SS (BLADE) ×1 IMPLANT
BLADE SURG 15 STRL LF DISP TIS (BLADE) IMPLANT
BLADE SURG 15 STRL SS (BLADE) ×1
BRIEF MESH DISP LRG (UNDERPADS AND DIAPERS) ×1 IMPLANT
CHLORAPREP W/TINT 26 (MISCELLANEOUS) IMPLANT
COVER BACK TABLE 60X90IN (DRAPES) ×1 IMPLANT
COVER MAYO STAND STRL (DRAPES) ×1 IMPLANT
DERMABOND ADVANCED .7 DNX12 (GAUZE/BANDAGES/DRESSINGS) ×1 IMPLANT
DRAPE HYSTEROSCOPY (MISCELLANEOUS) IMPLANT
DRAPE LAPAROTOMY 100X72 PEDS (DRAPES) ×1 IMPLANT
DRAPE SHEET LG 3/4 BI-LAMINATE (DRAPES) IMPLANT
DRAPE UTILITY XL STRL (DRAPES) ×1 IMPLANT
DRSG TEGADERM 4X4.75 (GAUZE/BANDAGES/DRESSINGS) IMPLANT
ELECT REM PT RETURN 9FT ADLT (ELECTROSURGICAL) ×1
ELECTRODE REM PT RTRN 9FT ADLT (ELECTROSURGICAL) ×1 IMPLANT
GAUZE 4X4 16PLY ~~LOC~~+RFID DBL (SPONGE) ×1 IMPLANT
GAUZE PAD ABD 8X10 STRL (GAUZE/BANDAGES/DRESSINGS) ×1 IMPLANT
GAUZE SPONGE 4X4 12PLY STRL (GAUZE/BANDAGES/DRESSINGS) IMPLANT
GLOVE BIO SURGEON STRL SZ 6.5 (GLOVE) ×1 IMPLANT
GLOVE INDICATOR 6.5 STRL GRN (GLOVE) ×1 IMPLANT
GOWN STRL REUS W/TWL XL LVL3 (GOWN DISPOSABLE) ×1 IMPLANT
KIT SIGMOIDOSCOPE (SET/KITS/TRAYS/PACK) IMPLANT
KIT TURNOVER CYSTO (KITS) ×1 IMPLANT
LEGGING LITHOTOMY PAIR STRL (DRAPES) IMPLANT
NDL HYPO 22X1.5 SAFETY MO (MISCELLANEOUS) ×1 IMPLANT
NDL SAFETY ECLIP 18X1.5 (MISCELLANEOUS) IMPLANT
NEEDLE HYPO 22X1.5 SAFETY MO (MISCELLANEOUS) ×1 IMPLANT
NS IRRIG 500ML POUR BTL (IV SOLUTION) ×1 IMPLANT
PACK BASIN DAY SURGERY FS (CUSTOM PROCEDURE TRAY) ×1 IMPLANT
PAD ARMBOARD 7.5X6 YLW CONV (MISCELLANEOUS) IMPLANT
PENCIL SMOKE EVACUATOR (MISCELLANEOUS) ×1 IMPLANT
SLEEVE SCD COMPRESS KNEE MED (STOCKING) ×1 IMPLANT
SPIKE FLUID TRANSFER (MISCELLANEOUS) ×1 IMPLANT
SPONGE SURGIFOAM ABS GEL 12-7 (HEMOSTASIS) IMPLANT
STRIP CLOSURE SKIN 1/2X4 (GAUZE/BANDAGES/DRESSINGS) IMPLANT
SUT CHROMIC 2 0 SH (SUTURE) IMPLANT
SUT CHROMIC 3 0 SH 27 (SUTURE) IMPLANT
SUT VIC AB 2-0 SH 27 (SUTURE)
SUT VIC AB 2-0 SH 27XBRD (SUTURE) IMPLANT
SUT VIC AB 3-0 SH 27 (SUTURE)
SUT VIC AB 3-0 SH 27X BRD (SUTURE) IMPLANT
SYR CONTROL 10ML LL (SYRINGE) ×1 IMPLANT
TOWEL OR 17X24 6PK STRL BLUE (TOWEL DISPOSABLE) ×1 IMPLANT
TRAY DSU PREP LF (CUSTOM PROCEDURE TRAY) ×1 IMPLANT
TUBE CONNECTING 12X1/4 (SUCTIONS) ×1 IMPLANT
UNDERPAD 30X36 HEAVY ABSORB (UNDERPADS AND DIAPERS) IMPLANT
WATER STERILE IRR 1000ML POUR (IV SOLUTION) ×1 IMPLANT
YANKAUER SUCT BULB TIP NO VENT (SUCTIONS) ×1 IMPLANT

## 2022-10-18 NOTE — Discharge Instructions (Addendum)
ANORECTAL SURGERY: POST OP INSTRUCTIONS Take your usually prescribed home medications unless otherwise directed. DIET: During the first few hours after surgery sip on some liquids until you are able to urinate.  It is normal to not urinate for several hours after this surgery.  If you feel uncomfortable, please contact the office for instructions.  After you are able to urinate,you may eat, if you feel like it.  Follow a light bland diet the first 24 hours after arrival home, such as soup, liquids, crackers, etc.  Be sure to include lots of fluids daily (6-8 glasses).  Avoid fast food or heavy meals, as your are more likely to get nauseated.  Eat a low fat diet the next few days after surgery.  Limit caffeine intake to 1-2 servings a day. PAIN CONTROL: Pain is best controlled by a usual combination of several different methods TOGETHER: Muscle relaxation: Soak in a warm bath (or Sitz bath) three times a day and after bowel movements.  Continue to do this until all pain is resolved. Over the counter pain medication Prescription pain medication Most patients will experience some swelling and discomfort in the anus/rectal area and incisions.  Heat such as warm towels, sitz baths, warm baths, etc to help relax tight/sore spots and speed recovery.  Some people prefer to use ice, especially in the first couple days after surgery, as it may decrease the pain and swelling, or alternate between ice & heat.  Experiment to what works for you.  Swelling and bruising can take several weeks to resolve.  Pain can take even longer to completely resolve. It is helpful to take an over-the-counter pain medication regularly for the first few weeks.  Choose one of the following that works best for you: Naproxen (Aleve, etc)  Two 220mg  tabs twice a day Ibuprofen (Advil, etc) Three 200mg  tabs four times a day (every meal & bedtime) A  prescription for pain medication (such as percocet, oxycodone, hydrocodone, etc) should be  given to you upon discharge.  Take your pain medication as prescribed.  If you are having problems/concerns with the prescription medicine (does not control pain, nausea, vomiting, rash, itching, etc), please call us [phone] to see if we need to switch you to a different pain medicine that will work better for you and/or control your side effect better. If you need a refill on your pain medication, please contact your pharmacy.  They will contact our office to request authorization. Prescriptions will not be filled after 5 pm or on week-ends. KEEP YOUR BOWELS REGULAR and AVOID CONSTIPATION The goal is one to two soft bowel movements a day.  You should at least have a bowel movement every other day. Avoid getting constipated.  Between the surgery and the pain medications, it is common to experience some constipation. This can be very painful after rectal surgery.  Increasing fluid intake and taking a fiber supplement (such as Metamucil, Citrucel, FiberCon, etc) 1-2 times a day regularly will usually help prevent this problem from occurring.  A stool softener like colace is also recommended.  This can be purchased over the counter at your pharmacy.  You can take it up to 3 times a day.  If you do not have a bowel movement after 24 hrs since your surgery, take one does of milk of magnesia.  If you still haven't had a bowel movement 8-12 hours after that dose, take another dose.  If you don't have a bowel movement 48 hrs after surgery,  purchase a Fleets enema from the drug store and administer gently per package instructions.  If you still are having trouble with your bowel movements after that, please call the office for further instructions. If you develop diarrhea or have many loose bowel movements, simplify your diet to bland foods & liquids for a few days.  Stop any stool softeners and decrease your fiber supplement.  Switching to mild anti-diarrheal medications (Kayopectate, Pepto Bismol) can help.   If this worsens or does not improve, please call us.  Wound Care Remove your bandages before your first bowel movement or 8 hours after surgery.     Remove any wound packing material at this tim,e as well.  You do not need to repack the wound unless instructed otherwise.  Wear an absorbent pad or soft cotton gauze in your underwear to catch any drainage and help keep the area clean. You should change this every 2-3 hours while awake. Keep the area clean and dry.  Bathe / shower every day, especially after bowel movements.  Keep the area clean by showering / bathing over the incision / wound.   It is okay to soak an open wound to help wash it.  Wet wipes or showers / gentle washing after bowel movements is often less traumatic than regular toilet paper. You may have some styrofoam-like soft packing in the rectum which will come out with the first bowel movement.  You will often notice bleeding with bowel movements.  This should slow down by the end of the first week of surgery Expect some drainage.  This should slow down, too, by the end of the first week of surgery.  Wear an absorbent pad or soft cotton gauze in your underwear until the drainage stops. Do Not sit on a rubber or pillow ring.  This can make you symptoms worse.  You may sit on a soft pillow if needed.  ACTIVITIES as tolerated:   You may resume regular (light) daily activities beginning the next day--such as daily self-care, walking, climbing stairs--gradually increasing activities as tolerated.  If you can walk 30 minutes without difficulty, it is safe to try more intense activity such as jogging, treadmill, bicycling, low-impact aerobics, swimming, etc. Save the most intensive and strenuous activity for last such as sit-ups, heavy lifting, contact sports, etc  Refrain from any heavy lifting or straining until you are off narcotics for pain control.   You may drive when you are no longer taking prescription pain medication, you can  comfortably sit for long periods of time, and you can safely maneuver your car and apply brakes. You may have sexual intercourse when it is comfortable.  FOLLOW UP in our office Please call [facility] at [phone] to set up an appointment to see your surgeon in the office for a follow-up appointment approximately 3-4 weeks after your surgery. Make sure that you call for this appointment the day you arrive home to insure a convenient appointment time. 10. IF YOU HAVE DISABILITY OR FAMILY LEAVE FORMS, BRING THEM TO THE OFFICE FOR PROCESSING.  DO NOT GIVE THEM TO YOUR DOCTOR.     WHEN TO CALL us [phone]: Poor pain control Reactions / problems with new medications (rash/itching, nausea, etc)  Fever over 101.5 F (38.5 C) Inability to urinate Nausea and/or vomiting Worsening swelling or bruising Continued bleeding from incision. Increased pain, redness, or drainage from the incision  The clinic staff is available to answer your questions during regular business hours (8:30am-5pm).  Please don't hesitate to call and ask to speak to one of our nurses for clinical concerns.   A surgeon from [facility] is always on call at the hospitals   If you have a medical emergency, go to the nearest emergency room or call 911.    [facility] [address], [address], [place], [place]  [address] ? MAIN: [phone]) [phone] ? TOLL FREE: [phone] ? FAX [phone] [email or web address]     Post Anesthesia Home Care Instructions  Activity: Get plenty of rest for the remainder of the day. A responsible individual must stay with you for 24 hours following the procedure.  For the next 24 hours, DO NOT: -Drive a car -Advertising copywriter -Drink alcoholic beverages -Take any medication unless instructed by your physician -Make any legal decisions or sign important papers.  Meals: Start with liquid foods such as gelatin or soup. Progress to regular  foods as tolerated. Avoid greasy, spicy, heavy foods. If nausea and/or vomiting occur, drink only clear liquids until the nausea and/or vomiting subsides. Call your physician if vomiting continues.  Special Instructions/Symptoms: Your throat may feel dry or sore from the anesthesia or the breathing tube placed in your throat during surgery. If this causes discomfort, gargle with warm salt water. The discomfort should disappear within 24 hours.  Do not take any Tylenol, or nonsteroidal anti inflammatories until after 12:15 pm today, if needed.   Information for Discharge Teaching: EXPAREL (bupivacaine liposome injectable suspension)   Pain relief is important to your recovery. The goal is to control your pain so you can move easier and return to your normal activities as soon as possible after your procedure. Your physician may use several types of medicines to manage pain, swelling, and more.  Your surgeon or anesthesiologist gave you EXPAREL(bupivacaine) to help control your pain after surgery.  EXPAREL is a local anesthetic designed to release slowly over an extended period of time to provide pain relief by numbing the tissue around the surgical site. EXPAREL is designed to release pain medication over time and can control pain for up to 72 hours. Depending on how you respond to EXPAREL, you may require less pain medication during your recovery. EXPAREL can help reduce or eliminate the need for opioids during the first few days after surgery when pain relief is needed the most. EXPAREL is not an opioid and is not addictive. It does not cause sleepiness or sedation.   Important! A teal colored band has been placed on your arm with the date, time and amount of EXPAREL you have received. Please leave this armband in place for the full 96 hours following administration, and then you may remove the band. If you return to the hospital for any reason within 96 hours following the administration of  EXPAREL, the armband provides important information that your health care providers to know, and alerts them that you have received this anesthetic.    Possible side effects of EXPAREL: Temporary loss of sensation or ability to move in the area where medication was injected. Nausea, vomiting, constipation Rarely, numbness and tingling in your mouth or lips, lightheadedness, or anxiety may occur. Call your doctor right away if you think you may be experiencing any of these sensations, or if you have other questions regarding possible side effects.  Follow all other discharge instructions given to you by your surgeon or nurse. Eat a healthy diet and drink plenty of water or other fluids.

## 2022-10-18 NOTE — Anesthesia Postprocedure Evaluation (Signed)
Anesthesia Post Note  Patient: [name]  Procedure(s) Performed: excison of hemorrhoid EXCISION OF ANAL SKIN TAG     Patient location during evaluation: PACU Anesthesia Type: MAC Level of consciousness: awake and alert Pain management: pain level controlled Vital Signs Assessment: post-procedure vital signs reviewed and stable Respiratory status: spontaneous breathing, nonlabored ventilation, respiratory function stable and patient connected to nasal cannula oxygen Cardiovascular status: stable and blood pressure returned to baseline Postop Assessment: no apparent nausea or vomiting Anesthetic complications: no  No notable events documented.  Last Vitals:  Vitals:   10/18/22 0900 10/18/22 0945  BP: 102/67 111/71  Pulse: 66 73  Resp: 20 16  Temp: (!) 36 C   SpO2: 100% 100%    Last Pain:  Vitals:   10/18/22 0945  TempSrc:   PainSc: 0-No pain                 Naomy Esham L Amand Lemoine

## 2022-10-18 NOTE — H&P (Signed)
REFERRING PHYSICIAN:  Elease Etienne*   PROVIDER:  Elenora Gamma, MD   MRN: [id number] DOB: 09/12/1975 DATE OF ENCOUNTER: 08/20/2022   Subjective   Chief Complaint: New Consultation       History of Present Illness: [name] [name] is a 47 y.o. female who is seen today as an office consultation at the request of Dr. Tomasa Rand for evaluation of New Consultation .  Patient presents to the office after recent colonoscopy showed a distal rectal polyp involving the dentate line.  She is here to discuss transanal excision.  She also has a small anal skin tag from a previous thrombosed hemorrhoid that she would like excised during the same time.     Review of Systems: A complete review of systems was obtained from the patient.  I have reviewed this information and discussed as appropriate with the patient.  See HPI as well for other ROS.       Medical History: Past Medical History Past Medical History: DiagnosisDate            Anxiety                         GERD (gastroesophageal reflux disease)                Problem List There is no problem list on file for this patient.     Past Surgical History Past Surgical History: ProcedureLateralityDate            breast surgery             2016            LAPAROSCOPIC TUBAL LIGATION             2017       Allergies No Known Allergies     Medications Ordered Prior to Encounter Current Outpatient Medications on File Prior to Visit MedicationSigDispenseRefill            dextroamphetamine-amphetamine (ADDERALL) 30 mg tablet        Take 0.5 tablets by mouth 2 (two) times daily                           traZODone (DESYREL) 50 MG tablet            Take 25-50 mg by mouth at bedtime as needed                        No current facility-administered medications on file prior to visit.       Family History Family History ProblemRelationAge of Onset            Skin cancer     Mother         Tobacco Use  History Social History     Tobacco Use Smoking StatusFormer Types:Cigarettes Start date:2007 Smokeless TobaccoNever       Social History Social History     Socioeconomic History Marital status:Married Tobacco Use Smoking status:Former                         Types: Cigarettes                         Start date:       2007 Smokeless tobacco:Never Substance and Sexual Activity Alcohol use:Yes Drug use:Yes  Objective:     Vitals:   10/18/22 0606  BP: 110/81  Pulse: 81  Resp: 16  Temp: 97.8 F (36.6 C)  SpO2: 99%    Exam Gen: NAD CV: RRR Lungs: CTA Abd: soft Rectal: Palpable anterior rectal mass approximately 1 cm in diameter     Labs, Imaging and Diagnostic Testing:     Assessment and Plan: Diagnoses and all orders for this visit:   Anal skin tag Small posterior anal skin tag.  I believe this can be easily excised during surgery. Rectal polyp Palpable anterior distal rectal mass approximately 1 cm in diameter     I have recommended exam under anesthesia with transanal excision of her rectal polyp and excision of the skin tag at the same time.  We discussed the typical postoperative pain and bleeding.  We discussed typical recovery times.  All questions were answered.  Patient would like to proceed with surgery.   Vanita Panda, MD Colon and Rectal Surgery [facility]

## 2022-10-18 NOTE — Transfer of Care (Signed)
Immediate Anesthesia Transfer of Care Note  Patient: RYLIEE MEHALL  Procedure(s) Performed: excison of hemorrhoid EXCISION OF ANAL SKIN TAG  Patient Location: PACU  Anesthesia Type:MAC  Level of Consciousness: drowsy and patient cooperative  Airway & Oxygen Therapy: Patient Spontanous Breathing  Post-op Assessment: Report given to RN and Post -op Vital signs reviewed and stable  Post vital signs: Reviewed and stable  Last Vitals:  Vitals Value Taken Time  BP    Temp    Pulse    Resp    SpO2      Last Pain:  Vitals:   10/18/22 0606  TempSrc: Oral         Complications: No notable events documented.

## 2022-10-18 NOTE — Anesthesia Preprocedure Evaluation (Addendum)
Anesthesia Evaluation  Patient identified by MRN, date of birth, ID band Patient awake    Reviewed: Allergy & Precautions, NPO status , Patient's Chart, lab work & pertinent test results  Airway Mallampati: II  TM Distance: >3 FB Neck ROM: Full  Mouth opening: Limited Mouth Opening  Dental no notable dental hx. (+) Teeth Intact, Dental Advisory Given   Pulmonary former smoker   Pulmonary exam normal breath sounds clear to auscultation       Cardiovascular negative cardio ROS Normal cardiovascular exam Rhythm:Regular Rate:Normal     Neuro/Psych  PSYCHIATRIC DISORDERS Anxiety Depression    negative neurological ROS     GI/Hepatic Neg liver ROS,GERD  ,,  Endo/Other  negative endocrine ROS    Renal/GU negative Renal ROS  negative genitourinary   Musculoskeletal negative musculoskeletal ROS (+)    Abdominal   Peds  (+) ADHD Hematology negative hematology ROS (+)   Anesthesia Other Findings   Reproductive/Obstetrics                             Anesthesia Physical Anesthesia Plan  ASA: 2  Anesthesia Plan: MAC   Post-op Pain Management: Tylenol PO (pre-op)*   Induction: Intravenous  PONV Risk Score and Plan: 2 and Propofol infusion, Treatment may vary due to age or medical condition, Midazolam, Dexamethasone and Ondansetron  Airway Management Planned: Natural Airway  Additional Equipment:   Intra-op Plan:   Post-operative Plan:   Informed Consent: I have reviewed the patients History and Physical, chart, labs and discussed the procedure including the risks, benefits and alternatives for the proposed anesthesia with the patient or authorized representative who has indicated his/her understanding and acceptance.     Dental advisory given  Plan Discussed with: CRNA  Anesthesia Plan Comments:        Anesthesia Quick Evaluation

## 2022-10-18 NOTE — Op Note (Signed)
10/18/2022  8:07 AM  PATIENT:  [name] [name]  47 y.o. female  Patient Care Team: Joaquim Nam, MD as PCP - General (Family Medicine)  PRE-OPERATIVE DIAGNOSIS:  RECTAL POLYP, ANAL SKIN TAG  POST-OPERATIVE DIAGNOSIS:  RECTAL POLYP, ANAL SKIN TAG  PROCEDURE:  excision of hemorrhoid with polyp EXCISION OF ANAL SKIN TAG    Surgeon(s): Romie Levee, MD  ASSISTANT: none   ANESTHESIA:   local and MAC  SPECIMEN:  Source of Specimen:  R posterior hemorrhoid, skin tag  DISPOSITION OF SPECIMEN:  PATHOLOGY  COUNTS:  YES  PLAN OF CARE: Discharge to home after PACU  PATIENT DISPOSITION:  PACU - hemodynamically stable.  INDICATION: 47 y.o. F with polypoid mass seen on colonoscopy    OR FINDINGS: polypoid mass on base of R posterior hemorrhoid  DESCRIPTION: the patient was identified in the preoperative holding area and taken to the OR where they were laid on the operating room table.  MAC anesthesia was induced without difficulty. The patient was then positioned in prone jackknife position with buttocks gently taped apart.  The patient was then prepped and draped in usual sterile fashion.  SCDs were noted to be in place prior to the initiation of anesthesia. A surgical timeout was performed indicating the correct patient, procedure, positioning and need for preoperative antibiotics.  A rectal block was performed using Marcaine with epinephrine mixed with Experel.    I began with a digital rectal exam.  The mass was palpated in the posterior anal canal.  I then placed a Hill-Ferguson anoscope into the anal canal and evaluated this completely.  There was a polypoid mass noted on the hemorrhoid that appeared to be causing prolapse.  I decided to perform a hemorrhoidectomy.  This was completed with sharp dissection, making sure to preserve the sphincter complex underneath.  The polyp and hemorrhoid were removed.  The incision was closed with a running 2-0 Chromic suture.  Hemostasis was  good.  I then removed the skin tag with a 15 blade scalpel.  This was closed with interrupted 3-0 Chromic sutures.    Additional Experel was placed for post op pain control.  A dressing was applied.  The patient was then awakened from anesthesia and sent to the PACU in stable condition.  All counts were correct per OR staff.    Vanita Panda, MD  Colorectal and General Surgery [facility]

## 2022-10-19 ENCOUNTER — Encounter (HOSPITAL_BASED_OUTPATIENT_CLINIC_OR_DEPARTMENT_OTHER): Payer: Self-pay | Admitting: General Surgery

## 2022-10-22 LAB — SURGICAL PATHOLOGY

## 2022-10-31 DIAGNOSIS — M7532 Calcific tendinitis of left shoulder: Secondary | ICD-10-CM | POA: Diagnosis not present

## 2022-11-01 ENCOUNTER — Telehealth: Payer: Self-pay | Admitting: Family Medicine

## 2022-11-01 NOTE — Telephone Encounter (Signed)
LOV - 09/11/22 NOV - not scheduled RF - in as historical provider

## 2022-11-01 NOTE — Telephone Encounter (Signed)
Prescription Request  11/01/2022  LOV: 09/11/2022  What is the name of the medication or equipment?  amphetamine-dextroamphetamine (ADDERALL) 30 MG tablet   Have you contacted your pharmacy to request a refill? No   Which pharmacy would you like this sent to?  [id number] [place], [place] - [address] [place] [address] Phone: [phone] Fax: [phone]     Patient notified that their request is being sent to the clinical staff for review and that they should receive a response within 2 business days.   Please advise at Mobile [phone] (mobile)

## 2022-11-02 MED ORDER — AMPHETAMINE-DEXTROAMPHETAMINE 30 MG PO TABS: 15.0000 mg | ORAL_TABLET | Freq: Two times a day (BID) | ORAL | 0 refills | Status: DC

## 2022-11-02 NOTE — Addendum Note (Signed)
Addended by: Joaquim Nam on: 11/02/2022 06:58 AM   Modules accepted: Orders

## 2022-11-02 NOTE — Telephone Encounter (Signed)
Sent. Thanks.   

## 2022-11-19 DIAGNOSIS — M25512 Pain in left shoulder: Secondary | ICD-10-CM | POA: Diagnosis not present

## 2022-11-22 DIAGNOSIS — M7542 Impingement syndrome of left shoulder: Secondary | ICD-10-CM | POA: Diagnosis not present

## 2023-01-25 DIAGNOSIS — M7542 Impingement syndrome of left shoulder: Secondary | ICD-10-CM | POA: Diagnosis not present

## 2023-01-25 DIAGNOSIS — S43432A Superior glenoid labrum lesion of left shoulder, initial encounter: Secondary | ICD-10-CM | POA: Diagnosis not present

## 2023-01-25 DIAGNOSIS — M24112 Other articular cartilage disorders, left shoulder: Secondary | ICD-10-CM | POA: Diagnosis not present

## 2023-01-25 DIAGNOSIS — X58XXXA Exposure to other specified factors, initial encounter: Secondary | ICD-10-CM | POA: Diagnosis not present

## 2023-01-25 DIAGNOSIS — G8918 Other acute postprocedural pain: Secondary | ICD-10-CM | POA: Diagnosis not present

## 2023-01-25 DIAGNOSIS — M7552 Bursitis of left shoulder: Secondary | ICD-10-CM | POA: Diagnosis not present

## 2023-01-25 DIAGNOSIS — M75112 Incomplete rotator cuff tear or rupture of left shoulder, not specified as traumatic: Secondary | ICD-10-CM | POA: Diagnosis not present

## 2023-01-25 DIAGNOSIS — Y999 Unspecified external cause status: Secondary | ICD-10-CM | POA: Diagnosis not present

## 2023-01-28 DIAGNOSIS — Z967 Presence of other bone and tendon implants: Secondary | ICD-10-CM | POA: Diagnosis not present

## 2023-01-28 DIAGNOSIS — I890 Lymphedema, not elsewhere classified: Secondary | ICD-10-CM | POA: Diagnosis not present

## 2023-01-28 DIAGNOSIS — Z4789 Encounter for other orthopedic aftercare: Secondary | ICD-10-CM | POA: Diagnosis not present

## 2023-01-28 DIAGNOSIS — M75112 Incomplete rotator cuff tear or rupture of left shoulder, not specified as traumatic: Secondary | ICD-10-CM | POA: Diagnosis not present

## 2023-01-28 DIAGNOSIS — Z87891 Personal history of nicotine dependence: Secondary | ICD-10-CM | POA: Diagnosis not present

## 2023-01-28 DIAGNOSIS — M25512 Pain in left shoulder: Secondary | ICD-10-CM | POA: Diagnosis not present

## 2023-01-28 DIAGNOSIS — F1520 Other stimulant dependence, uncomplicated: Secondary | ICD-10-CM | POA: Diagnosis not present

## 2023-01-29 DIAGNOSIS — M75112 Incomplete rotator cuff tear or rupture of left shoulder, not specified as traumatic: Secondary | ICD-10-CM | POA: Diagnosis not present

## 2023-01-29 DIAGNOSIS — I890 Lymphedema, not elsewhere classified: Secondary | ICD-10-CM | POA: Diagnosis not present

## 2023-01-29 DIAGNOSIS — Z4789 Encounter for other orthopedic aftercare: Secondary | ICD-10-CM | POA: Diagnosis not present

## 2023-01-29 DIAGNOSIS — M25512 Pain in left shoulder: Secondary | ICD-10-CM | POA: Diagnosis not present

## 2023-01-30 DIAGNOSIS — M25512 Pain in left shoulder: Secondary | ICD-10-CM | POA: Diagnosis not present

## 2023-01-30 DIAGNOSIS — I890 Lymphedema, not elsewhere classified: Secondary | ICD-10-CM | POA: Diagnosis not present

## 2023-01-30 DIAGNOSIS — M75112 Incomplete rotator cuff tear or rupture of left shoulder, not specified as traumatic: Secondary | ICD-10-CM | POA: Diagnosis not present

## 2023-01-30 DIAGNOSIS — Z4789 Encounter for other orthopedic aftercare: Secondary | ICD-10-CM | POA: Diagnosis not present

## 2023-01-31 DIAGNOSIS — Z4789 Encounter for other orthopedic aftercare: Secondary | ICD-10-CM | POA: Diagnosis not present

## 2023-01-31 DIAGNOSIS — M25512 Pain in left shoulder: Secondary | ICD-10-CM | POA: Diagnosis not present

## 2023-01-31 DIAGNOSIS — I890 Lymphedema, not elsewhere classified: Secondary | ICD-10-CM | POA: Diagnosis not present

## 2023-01-31 DIAGNOSIS — M75112 Incomplete rotator cuff tear or rupture of left shoulder, not specified as traumatic: Secondary | ICD-10-CM | POA: Diagnosis not present

## 2023-02-01 DIAGNOSIS — Z4789 Encounter for other orthopedic aftercare: Secondary | ICD-10-CM | POA: Diagnosis not present

## 2023-02-01 DIAGNOSIS — M75112 Incomplete rotator cuff tear or rupture of left shoulder, not specified as traumatic: Secondary | ICD-10-CM | POA: Diagnosis not present

## 2023-02-01 DIAGNOSIS — I890 Lymphedema, not elsewhere classified: Secondary | ICD-10-CM | POA: Diagnosis not present

## 2023-02-01 DIAGNOSIS — M25512 Pain in left shoulder: Secondary | ICD-10-CM | POA: Diagnosis not present

## 2023-02-02 ENCOUNTER — Other Ambulatory Visit: Payer: Self-pay | Admitting: Family Medicine

## 2023-02-02 DIAGNOSIS — I890 Lymphedema, not elsewhere classified: Secondary | ICD-10-CM | POA: Diagnosis not present

## 2023-02-02 DIAGNOSIS — Z4789 Encounter for other orthopedic aftercare: Secondary | ICD-10-CM | POA: Diagnosis not present

## 2023-02-02 DIAGNOSIS — M25512 Pain in left shoulder: Secondary | ICD-10-CM | POA: Diagnosis not present

## 2023-02-02 DIAGNOSIS — M75112 Incomplete rotator cuff tear or rupture of left shoulder, not specified as traumatic: Secondary | ICD-10-CM | POA: Diagnosis not present

## 2023-02-03 DIAGNOSIS — M25512 Pain in left shoulder: Secondary | ICD-10-CM | POA: Diagnosis not present

## 2023-02-03 DIAGNOSIS — M75112 Incomplete rotator cuff tear or rupture of left shoulder, not specified as traumatic: Secondary | ICD-10-CM | POA: Diagnosis not present

## 2023-02-03 DIAGNOSIS — Z4789 Encounter for other orthopedic aftercare: Secondary | ICD-10-CM | POA: Diagnosis not present

## 2023-02-03 DIAGNOSIS — I890 Lymphedema, not elsewhere classified: Secondary | ICD-10-CM | POA: Diagnosis not present

## 2023-02-04 DIAGNOSIS — M25512 Pain in left shoulder: Secondary | ICD-10-CM | POA: Diagnosis not present

## 2023-02-04 DIAGNOSIS — M75112 Incomplete rotator cuff tear or rupture of left shoulder, not specified as traumatic: Secondary | ICD-10-CM | POA: Diagnosis not present

## 2023-02-04 DIAGNOSIS — Z4789 Encounter for other orthopedic aftercare: Secondary | ICD-10-CM | POA: Diagnosis not present

## 2023-02-04 DIAGNOSIS — I890 Lymphedema, not elsewhere classified: Secondary | ICD-10-CM | POA: Diagnosis not present

## 2023-02-04 MED ORDER — AMPHETAMINE-DEXTROAMPHETAMINE 30 MG PO TABS: 15.0000 mg | ORAL_TABLET | Freq: Two times a day (BID) | ORAL | 0 refills | Status: DC

## 2023-02-04 NOTE — Telephone Encounter (Signed)
Last office visit: 09/11/22 Next office visit: NOTHING SCHEDULED Last refill:  11/02/22

## 2023-02-05 DIAGNOSIS — Z4789 Encounter for other orthopedic aftercare: Secondary | ICD-10-CM | POA: Diagnosis not present

## 2023-02-05 DIAGNOSIS — I890 Lymphedema, not elsewhere classified: Secondary | ICD-10-CM | POA: Diagnosis not present

## 2023-02-05 DIAGNOSIS — M25512 Pain in left shoulder: Secondary | ICD-10-CM | POA: Diagnosis not present

## 2023-02-05 DIAGNOSIS — M75112 Incomplete rotator cuff tear or rupture of left shoulder, not specified as traumatic: Secondary | ICD-10-CM | POA: Diagnosis not present

## 2023-02-06 DIAGNOSIS — M75112 Incomplete rotator cuff tear or rupture of left shoulder, not specified as traumatic: Secondary | ICD-10-CM | POA: Diagnosis not present

## 2023-02-06 DIAGNOSIS — I890 Lymphedema, not elsewhere classified: Secondary | ICD-10-CM | POA: Diagnosis not present

## 2023-02-06 DIAGNOSIS — Z4789 Encounter for other orthopedic aftercare: Secondary | ICD-10-CM | POA: Diagnosis not present

## 2023-02-06 DIAGNOSIS — M75102 Unspecified rotator cuff tear or rupture of left shoulder, not specified as traumatic: Secondary | ICD-10-CM | POA: Diagnosis not present

## 2023-02-06 DIAGNOSIS — M25512 Pain in left shoulder: Secondary | ICD-10-CM | POA: Diagnosis not present

## 2023-02-07 DIAGNOSIS — Z4789 Encounter for other orthopedic aftercare: Secondary | ICD-10-CM | POA: Diagnosis not present

## 2023-02-07 DIAGNOSIS — I890 Lymphedema, not elsewhere classified: Secondary | ICD-10-CM | POA: Diagnosis not present

## 2023-02-07 DIAGNOSIS — M75112 Incomplete rotator cuff tear or rupture of left shoulder, not specified as traumatic: Secondary | ICD-10-CM | POA: Diagnosis not present

## 2023-02-07 DIAGNOSIS — M25512 Pain in left shoulder: Secondary | ICD-10-CM | POA: Diagnosis not present

## 2023-02-08 DIAGNOSIS — M25512 Pain in left shoulder: Secondary | ICD-10-CM | POA: Diagnosis not present

## 2023-02-08 DIAGNOSIS — M75112 Incomplete rotator cuff tear or rupture of left shoulder, not specified as traumatic: Secondary | ICD-10-CM | POA: Diagnosis not present

## 2023-02-08 DIAGNOSIS — Z4789 Encounter for other orthopedic aftercare: Secondary | ICD-10-CM | POA: Diagnosis not present

## 2023-02-08 DIAGNOSIS — I890 Lymphedema, not elsewhere classified: Secondary | ICD-10-CM | POA: Diagnosis not present

## 2023-02-09 DIAGNOSIS — M75112 Incomplete rotator cuff tear or rupture of left shoulder, not specified as traumatic: Secondary | ICD-10-CM | POA: Diagnosis not present

## 2023-02-09 DIAGNOSIS — Z4789 Encounter for other orthopedic aftercare: Secondary | ICD-10-CM | POA: Diagnosis not present

## 2023-02-09 DIAGNOSIS — I890 Lymphedema, not elsewhere classified: Secondary | ICD-10-CM | POA: Diagnosis not present

## 2023-02-09 DIAGNOSIS — M25512 Pain in left shoulder: Secondary | ICD-10-CM | POA: Diagnosis not present

## 2023-02-10 DIAGNOSIS — M25512 Pain in left shoulder: Secondary | ICD-10-CM | POA: Diagnosis not present

## 2023-02-10 DIAGNOSIS — I890 Lymphedema, not elsewhere classified: Secondary | ICD-10-CM | POA: Diagnosis not present

## 2023-02-10 DIAGNOSIS — M75112 Incomplete rotator cuff tear or rupture of left shoulder, not specified as traumatic: Secondary | ICD-10-CM | POA: Diagnosis not present

## 2023-02-10 DIAGNOSIS — Z4789 Encounter for other orthopedic aftercare: Secondary | ICD-10-CM | POA: Diagnosis not present

## 2023-02-11 DIAGNOSIS — M25512 Pain in left shoulder: Secondary | ICD-10-CM | POA: Diagnosis not present

## 2023-02-11 DIAGNOSIS — I890 Lymphedema, not elsewhere classified: Secondary | ICD-10-CM | POA: Diagnosis not present

## 2023-02-11 DIAGNOSIS — M75112 Incomplete rotator cuff tear or rupture of left shoulder, not specified as traumatic: Secondary | ICD-10-CM | POA: Diagnosis not present

## 2023-02-11 DIAGNOSIS — Z4789 Encounter for other orthopedic aftercare: Secondary | ICD-10-CM | POA: Diagnosis not present

## 2023-02-12 DIAGNOSIS — Z4789 Encounter for other orthopedic aftercare: Secondary | ICD-10-CM | POA: Diagnosis not present

## 2023-02-12 DIAGNOSIS — M25512 Pain in left shoulder: Secondary | ICD-10-CM | POA: Diagnosis not present

## 2023-02-12 DIAGNOSIS — M75112 Incomplete rotator cuff tear or rupture of left shoulder, not specified as traumatic: Secondary | ICD-10-CM | POA: Diagnosis not present

## 2023-02-12 DIAGNOSIS — I890 Lymphedema, not elsewhere classified: Secondary | ICD-10-CM | POA: Diagnosis not present

## 2023-02-13 DIAGNOSIS — Z4789 Encounter for other orthopedic aftercare: Secondary | ICD-10-CM | POA: Diagnosis not present

## 2023-02-13 DIAGNOSIS — M25512 Pain in left shoulder: Secondary | ICD-10-CM | POA: Diagnosis not present

## 2023-02-13 DIAGNOSIS — M75112 Incomplete rotator cuff tear or rupture of left shoulder, not specified as traumatic: Secondary | ICD-10-CM | POA: Diagnosis not present

## 2023-02-13 DIAGNOSIS — I890 Lymphedema, not elsewhere classified: Secondary | ICD-10-CM | POA: Diagnosis not present

## 2023-02-14 DIAGNOSIS — Z4789 Encounter for other orthopedic aftercare: Secondary | ICD-10-CM | POA: Diagnosis not present

## 2023-02-14 DIAGNOSIS — I890 Lymphedema, not elsewhere classified: Secondary | ICD-10-CM | POA: Diagnosis not present

## 2023-02-14 DIAGNOSIS — M75112 Incomplete rotator cuff tear or rupture of left shoulder, not specified as traumatic: Secondary | ICD-10-CM | POA: Diagnosis not present

## 2023-02-14 DIAGNOSIS — M25512 Pain in left shoulder: Secondary | ICD-10-CM | POA: Diagnosis not present

## 2023-02-15 DIAGNOSIS — M25512 Pain in left shoulder: Secondary | ICD-10-CM | POA: Diagnosis not present

## 2023-02-15 DIAGNOSIS — M75102 Unspecified rotator cuff tear or rupture of left shoulder, not specified as traumatic: Secondary | ICD-10-CM | POA: Diagnosis not present

## 2023-02-15 DIAGNOSIS — I890 Lymphedema, not elsewhere classified: Secondary | ICD-10-CM | POA: Diagnosis not present

## 2023-02-15 DIAGNOSIS — M75112 Incomplete rotator cuff tear or rupture of left shoulder, not specified as traumatic: Secondary | ICD-10-CM | POA: Diagnosis not present

## 2023-02-15 DIAGNOSIS — Z4789 Encounter for other orthopedic aftercare: Secondary | ICD-10-CM | POA: Diagnosis not present

## 2023-02-16 DIAGNOSIS — I890 Lymphedema, not elsewhere classified: Secondary | ICD-10-CM | POA: Diagnosis not present

## 2023-02-16 DIAGNOSIS — M25512 Pain in left shoulder: Secondary | ICD-10-CM | POA: Diagnosis not present

## 2023-02-16 DIAGNOSIS — M75112 Incomplete rotator cuff tear or rupture of left shoulder, not specified as traumatic: Secondary | ICD-10-CM | POA: Diagnosis not present

## 2023-02-16 DIAGNOSIS — Z4789 Encounter for other orthopedic aftercare: Secondary | ICD-10-CM | POA: Diagnosis not present

## 2023-02-17 DIAGNOSIS — M25512 Pain in left shoulder: Secondary | ICD-10-CM | POA: Diagnosis not present

## 2023-02-17 DIAGNOSIS — I890 Lymphedema, not elsewhere classified: Secondary | ICD-10-CM | POA: Diagnosis not present

## 2023-02-17 DIAGNOSIS — Z4789 Encounter for other orthopedic aftercare: Secondary | ICD-10-CM | POA: Diagnosis not present

## 2023-02-17 DIAGNOSIS — M75112 Incomplete rotator cuff tear or rupture of left shoulder, not specified as traumatic: Secondary | ICD-10-CM | POA: Diagnosis not present

## 2023-02-18 DIAGNOSIS — Z4789 Encounter for other orthopedic aftercare: Secondary | ICD-10-CM | POA: Diagnosis not present

## 2023-02-18 DIAGNOSIS — M75112 Incomplete rotator cuff tear or rupture of left shoulder, not specified as traumatic: Secondary | ICD-10-CM | POA: Diagnosis not present

## 2023-02-18 DIAGNOSIS — M25512 Pain in left shoulder: Secondary | ICD-10-CM | POA: Diagnosis not present

## 2023-02-18 DIAGNOSIS — I890 Lymphedema, not elsewhere classified: Secondary | ICD-10-CM | POA: Diagnosis not present

## 2023-02-19 DIAGNOSIS — M25512 Pain in left shoulder: Secondary | ICD-10-CM | POA: Diagnosis not present

## 2023-02-19 DIAGNOSIS — M75112 Incomplete rotator cuff tear or rupture of left shoulder, not specified as traumatic: Secondary | ICD-10-CM | POA: Diagnosis not present

## 2023-02-19 DIAGNOSIS — Z4789 Encounter for other orthopedic aftercare: Secondary | ICD-10-CM | POA: Diagnosis not present

## 2023-02-19 DIAGNOSIS — I890 Lymphedema, not elsewhere classified: Secondary | ICD-10-CM | POA: Diagnosis not present

## 2023-02-20 DIAGNOSIS — I890 Lymphedema, not elsewhere classified: Secondary | ICD-10-CM | POA: Diagnosis not present

## 2023-02-20 DIAGNOSIS — M75112 Incomplete rotator cuff tear or rupture of left shoulder, not specified as traumatic: Secondary | ICD-10-CM | POA: Diagnosis not present

## 2023-02-20 DIAGNOSIS — Z4789 Encounter for other orthopedic aftercare: Secondary | ICD-10-CM | POA: Diagnosis not present

## 2023-02-20 DIAGNOSIS — M75102 Unspecified rotator cuff tear or rupture of left shoulder, not specified as traumatic: Secondary | ICD-10-CM | POA: Diagnosis not present

## 2023-02-20 DIAGNOSIS — M25512 Pain in left shoulder: Secondary | ICD-10-CM | POA: Diagnosis not present

## 2023-02-21 DIAGNOSIS — M75112 Incomplete rotator cuff tear or rupture of left shoulder, not specified as traumatic: Secondary | ICD-10-CM | POA: Diagnosis not present

## 2023-02-21 DIAGNOSIS — M25512 Pain in left shoulder: Secondary | ICD-10-CM | POA: Diagnosis not present

## 2023-02-21 DIAGNOSIS — I890 Lymphedema, not elsewhere classified: Secondary | ICD-10-CM | POA: Diagnosis not present

## 2023-02-21 DIAGNOSIS — Z4789 Encounter for other orthopedic aftercare: Secondary | ICD-10-CM | POA: Diagnosis not present

## 2023-02-25 DIAGNOSIS — M25512 Pain in left shoulder: Secondary | ICD-10-CM | POA: Diagnosis not present

## 2023-02-25 DIAGNOSIS — M75102 Unspecified rotator cuff tear or rupture of left shoulder, not specified as traumatic: Secondary | ICD-10-CM | POA: Diagnosis not present

## 2023-03-08 DIAGNOSIS — M25512 Pain in left shoulder: Secondary | ICD-10-CM | POA: Diagnosis not present

## 2023-03-08 DIAGNOSIS — M75102 Unspecified rotator cuff tear or rupture of left shoulder, not specified as traumatic: Secondary | ICD-10-CM | POA: Diagnosis not present

## 2023-03-27 DIAGNOSIS — M25512 Pain in left shoulder: Secondary | ICD-10-CM | POA: Diagnosis not present

## 2023-03-27 DIAGNOSIS — M75102 Unspecified rotator cuff tear or rupture of left shoulder, not specified as traumatic: Secondary | ICD-10-CM | POA: Diagnosis not present

## 2023-04-03 DIAGNOSIS — M25512 Pain in left shoulder: Secondary | ICD-10-CM | POA: Diagnosis not present

## 2023-04-03 DIAGNOSIS — M75102 Unspecified rotator cuff tear or rupture of left shoulder, not specified as traumatic: Secondary | ICD-10-CM | POA: Diagnosis not present

## 2023-04-16 DIAGNOSIS — M75102 Unspecified rotator cuff tear or rupture of left shoulder, not specified as traumatic: Secondary | ICD-10-CM | POA: Diagnosis not present

## 2023-04-16 DIAGNOSIS — M25512 Pain in left shoulder: Secondary | ICD-10-CM | POA: Diagnosis not present

## 2023-05-03 DIAGNOSIS — M25512 Pain in left shoulder: Secondary | ICD-10-CM | POA: Diagnosis not present

## 2023-05-03 DIAGNOSIS — M75102 Unspecified rotator cuff tear or rupture of left shoulder, not specified as traumatic: Secondary | ICD-10-CM | POA: Diagnosis not present

## 2023-05-13 DIAGNOSIS — Z4789 Encounter for other orthopedic aftercare: Secondary | ICD-10-CM | POA: Diagnosis not present

## 2023-05-15 ENCOUNTER — Other Ambulatory Visit: Payer: Self-pay | Admitting: Family Medicine

## 2023-05-16 DIAGNOSIS — M25512 Pain in left shoulder: Secondary | ICD-10-CM | POA: Diagnosis not present

## 2023-05-16 DIAGNOSIS — M75102 Unspecified rotator cuff tear or rupture of left shoulder, not specified as traumatic: Secondary | ICD-10-CM | POA: Diagnosis not present

## 2023-05-16 MED ORDER — AMPHETAMINE-DEXTROAMPHETAMINE 30 MG PO TABS: 15.0000 mg | ORAL_TABLET | Freq: Two times a day (BID) | ORAL | 0 refills | Status: DC

## 2023-05-16 NOTE — Telephone Encounter (Signed)
 Patient has been scheduled

## 2023-05-16 NOTE — Telephone Encounter (Signed)
 Sent. Thanks.

## 2023-05-16 NOTE — Telephone Encounter (Signed)
 LOV 09/11/22 acute  NOV 06/13/23 CPE  Last refill 02/04/23 90 TABLETS through 3 separate Rxs

## 2023-05-16 NOTE — Telephone Encounter (Signed)
 Patient is overdue for CPE, please schedule and route back for refill.

## 2023-05-24 DIAGNOSIS — M25512 Pain in left shoulder: Secondary | ICD-10-CM | POA: Diagnosis not present

## 2023-05-24 DIAGNOSIS — M75102 Unspecified rotator cuff tear or rupture of left shoulder, not specified as traumatic: Secondary | ICD-10-CM | POA: Diagnosis not present

## 2023-05-30 DIAGNOSIS — M25512 Pain in left shoulder: Secondary | ICD-10-CM | POA: Diagnosis not present

## 2023-05-30 DIAGNOSIS — M75102 Unspecified rotator cuff tear or rupture of left shoulder, not specified as traumatic: Secondary | ICD-10-CM | POA: Diagnosis not present

## 2023-06-06 DIAGNOSIS — M75102 Unspecified rotator cuff tear or rupture of left shoulder, not specified as traumatic: Secondary | ICD-10-CM | POA: Diagnosis not present

## 2023-06-06 DIAGNOSIS — M25512 Pain in left shoulder: Secondary | ICD-10-CM | POA: Diagnosis not present

## 2023-06-12 DIAGNOSIS — M25512 Pain in left shoulder: Secondary | ICD-10-CM | POA: Diagnosis not present

## 2023-06-12 DIAGNOSIS — M75102 Unspecified rotator cuff tear or rupture of left shoulder, not specified as traumatic: Secondary | ICD-10-CM | POA: Diagnosis not present

## 2023-06-13 ENCOUNTER — Ambulatory Visit (INDEPENDENT_AMBULATORY_CARE_PROVIDER_SITE_OTHER): Admitting: Family Medicine

## 2023-06-13 ENCOUNTER — Encounter: Payer: Self-pay | Admitting: Family Medicine

## 2023-06-13 VITALS — BP 122/78 | HR 83 | Temp 98.2°F | Ht 63.07 in | Wt 135.6 lb

## 2023-06-13 DIAGNOSIS — G4700 Insomnia, unspecified: Secondary | ICD-10-CM

## 2023-06-13 DIAGNOSIS — Z23 Encounter for immunization: Secondary | ICD-10-CM

## 2023-06-13 DIAGNOSIS — Z7189 Other specified counseling: Secondary | ICD-10-CM

## 2023-06-13 DIAGNOSIS — F988 Other specified behavioral and emotional disorders with onset usually occurring in childhood and adolescence: Secondary | ICD-10-CM

## 2023-06-13 DIAGNOSIS — Z13220 Encounter for screening for lipoid disorders: Secondary | ICD-10-CM | POA: Diagnosis not present

## 2023-06-13 DIAGNOSIS — Z0000 Encounter for general adult medical examination without abnormal findings: Secondary | ICD-10-CM

## 2023-06-13 LAB — BASIC METABOLIC PANEL WITH GFR
BUN: 14 mg/dL (ref 6–23)
CO2: 29 meq/L (ref 19–32)
Calcium: 8.8 mg/dL (ref 8.4–10.5)
Chloride: 105 meq/L (ref 96–112)
Creatinine, Ser: 0.64 mg/dL (ref 0.40–1.20)
GFR: 105.02 mL/min (ref >60.00)
Glucose, Bld: 81 mg/dL (ref 70–99)
Potassium: 4.1 meq/L (ref 3.5–5.1)
Sodium: 140 meq/L (ref 135–145)

## 2023-06-13 LAB — LIPID PANEL
Cholesterol: 173 mg/dL (ref 0–200)
HDL: 75.2 mg/dL (ref >39.00)
LDL Cholesterol: 81 mg/dL (ref 0–99)
NonHDL: 97.52
Total CHOL/HDL Ratio: 2
Triglycerides: 84 mg/dL (ref 0.0–149.0)
VLDL: 16.8 mg/dL (ref 0.0–40.0)

## 2023-06-13 LAB — TSH: TSH: 2.24 u[IU]/mL (ref 0.35–5.50)

## 2023-06-13 NOTE — Patient Instructions (Signed)
 Go to the lab on the way out.   If you have mychart we'll likely use that to update you.    Please ask the gynecology clinic to send me a copy of your results.  Take care.  Glad to see you. Tetanus shot today.

## 2023-06-13 NOTE — Progress Notes (Signed)
 CPE- See plan.  Routine anticipatory guidance given to patient.  See health maintenance.  The possibility exists that previously documented standard health maintenance information may have been brought forward from a previous encounter into this note.  If needed, that same information has been updated to reflect the current situation based on today's encounter.    Tetanus 2025 PNA and shingles not due. Flu shot encouraged.  covid vaccine done 2021 Colonoscopy 2024.  Mammogram pending per patient report.  Done via gyn clinic.  DXA not due.  Pap per gyn clinic.   Living will d/w pt. Would have her mother and [name] [name] equally designated if patient were incapacitated.  Diet and exercise d/w pt, both encouraged.     She had surgery then shoulder injection per ortho.  Still in PT.    She is putting up with hot flashes.    Taking trazodone a few times a week with relief.  No ADE on med.  It helps.    ADD.  Still on adderall.  No tremor, no ADE on med.  Occ used on weekends.  Taken on weekdays.  It helps with work.    Lipid screening today.  Fasting.  See notes on labs.  PMH and SH reviewed  Meds, vitals, and allergies reviewed.   ROS: Per HPI.  Unless specifically indicated otherwise in HPI, the patient denies:  General: fever. Eyes: acute vision changes ENT: sore throat Cardiovascular: chest pain Respiratory: SOB GI: vomiting GU: dysuria Musculoskeletal: acute back pain Derm: acute rash Neuro: acute motor dysfunction Psych: worsening mood Endocrine: polydipsia Heme: bleeding Allergy: hayfever  GEN: nad, alert and oriented HEENT: ncat NECK: supple w/o LA CV: rrr. PULM: ctab, no inc wob ABD: soft, +bs EXT: no edema SKIN: no acute rash She still has pain with L shoulder internal rotation.  See after visit summary.

## 2023-06-16 ENCOUNTER — Encounter: Payer: Self-pay | Admitting: Family Medicine

## 2023-06-16 NOTE — Assessment & Plan Note (Signed)
 Continue Adderall as needed.

## 2023-06-16 NOTE — Assessment & Plan Note (Signed)
Continue trazodone as needed. 

## 2023-06-16 NOTE — Assessment & Plan Note (Signed)
Living will d/w pt. Would have her mother and Ron Nagorski equally designated if patient were incapacitated.  

## 2023-06-16 NOTE — Assessment & Plan Note (Signed)
 Tetanus 2025 PNA and shingles not due. Flu shot encouraged.  covid vaccine done 2021 Colonoscopy 2024.  Mammogram pending per patient report.  Done via gyn clinic.  DXA not due.  Pap per gyn clinic.   Living will d/w pt. Would have her mother and Kambrey Hagger equally designated if patient were incapacitated.  Diet and exercise d/w pt, both encouraged.

## 2023-06-19 DIAGNOSIS — M25512 Pain in left shoulder: Secondary | ICD-10-CM | POA: Diagnosis not present

## 2023-06-19 DIAGNOSIS — M75102 Unspecified rotator cuff tear or rupture of left shoulder, not specified as traumatic: Secondary | ICD-10-CM | POA: Diagnosis not present

## 2023-07-03 DIAGNOSIS — Z1151 Encounter for screening for human papillomavirus (HPV): Secondary | ICD-10-CM | POA: Diagnosis not present

## 2023-07-03 DIAGNOSIS — Z124 Encounter for screening for malignant neoplasm of cervix: Secondary | ICD-10-CM | POA: Diagnosis not present

## 2023-07-03 DIAGNOSIS — Z6824 Body mass index (BMI) 24.0-24.9, adult: Secondary | ICD-10-CM | POA: Diagnosis not present

## 2023-07-03 DIAGNOSIS — Z01419 Encounter for gynecological examination (general) (routine) without abnormal findings: Secondary | ICD-10-CM | POA: Diagnosis not present

## 2023-07-03 DIAGNOSIS — Z1231 Encounter for screening mammogram for malignant neoplasm of breast: Secondary | ICD-10-CM | POA: Diagnosis not present

## 2023-07-18 DIAGNOSIS — N852 Hypertrophy of uterus: Secondary | ICD-10-CM | POA: Diagnosis not present

## 2023-08-19 ENCOUNTER — Other Ambulatory Visit: Payer: Self-pay | Admitting: Family Medicine

## 2023-08-19 ENCOUNTER — Encounter: Payer: Self-pay | Admitting: Family Medicine

## 2023-08-19 NOTE — Telephone Encounter (Unsigned)
 Copied from CRM [id number]. Topic: Clinical - Prescription Issue >> Aug 19, 2023  9:10 AM Everlene Hobby D wrote: amphetamine -dextroamphetamine  (ADDERALL) 30 MG tablet- Patient said she called pharmacy saying she doesn't have anymore prescriptions. Says her Mychart says to contact the pharmacy. 3 month supply for June, July, and August is needed.  [id number] [place], [place] - [address] [place] [address] Phone: [phone] Fax: [phone] Hours: Not open 24 hours

## 2023-08-19 NOTE — Telephone Encounter (Signed)
 LOV:06/13/23 [id number] scheduled LAST REFILL: amphetamine -dextroamphetamine  (ADDERALL) 30 MG tablet 0

## 2023-08-20 MED ORDER — AMPHETAMINE-DEXTROAMPHETAMINE 30 MG PO TABS: 15.0000 mg | ORAL_TABLET | Freq: Two times a day (BID) | ORAL | 0 refills | Status: DC

## 2023-08-20 NOTE — Telephone Encounter (Signed)
Sent x3. Thanks.

## 2023-11-12 ENCOUNTER — Encounter: Payer: Self-pay | Admitting: Family Medicine

## 2023-11-12 NOTE — Telephone Encounter (Signed)
 LOV: 06/13/23 NOV: nothing scheduled Last Refill: amphetamine -dextroamphetamine  (ADDERALL) 30 MG tablet  08/20/23 30 tablets 0 refills

## 2023-11-13 ENCOUNTER — Other Ambulatory Visit: Payer: Self-pay | Admitting: Family Medicine

## 2023-11-13 MED ORDER — AMPHETAMINE-DEXTROAMPHETAMINE 30 MG PO TABS: 15.0000 mg | ORAL_TABLET | Freq: Two times a day (BID) | ORAL | 0 refills | Status: DC

## 2024-02-17 ENCOUNTER — Encounter: Payer: Self-pay | Admitting: Family Medicine

## 2024-02-17 NOTE — Telephone Encounter (Signed)
 Requesting: Adderall  Contract: No  Last Visit: 08/19/2023 Next Visit: Visit date not found Last Refill: 11/13/23 given 3 refills   Please Advise

## 2024-02-18 MED ORDER — AMPHETAMINE-DEXTROAMPHETAMINE 30 MG PO TABS: 15.0000 mg | ORAL_TABLET | Freq: Two times a day (BID) | ORAL | 0 refills | Status: AC

## 2024-02-18 NOTE — Telephone Encounter (Signed)
 Sent. Thanks.
